# Patient Record
Sex: Female | Born: 1975 | Race: Black or African American | Hispanic: No | Marital: Married | Smoking: Never smoker
Health system: Southern US, Community
[De-identification: ages and names within clinical notes are randomized; demographics above are authoritative.]

## PROBLEM LIST (undated history)

## (undated) DIAGNOSIS — D75839 Thrombocytosis, unspecified: Secondary | ICD-10-CM

## (undated) HISTORY — PX: TUBAL LIGATION: SHX77

---

## 2016-07-03 ENCOUNTER — Emergency Department (HOSPITAL_COMMUNITY): Payer: Managed Care, Other (non HMO)

## 2016-07-03 ENCOUNTER — Encounter (HOSPITAL_COMMUNITY): Payer: Self-pay

## 2016-07-03 ENCOUNTER — Emergency Department (HOSPITAL_COMMUNITY)
Admission: EM | Admit: 2016-07-03 | Discharge: 2016-07-04 | Disposition: A | Discharge status: Home / Self Care | Payer: Managed Care, Other (non HMO) | Attending: Emergency Medicine | Admitting: Emergency Medicine

## 2016-07-03 DIAGNOSIS — R0602 Shortness of breath: Secondary | ICD-10-CM | POA: Insufficient documentation

## 2016-07-03 DIAGNOSIS — Z79899 Other long term (current) drug therapy: Secondary | ICD-10-CM | POA: Insufficient documentation

## 2016-07-03 LAB — BASIC METABOLIC PANEL
Anion gap: 8 (ref 5–15)
BUN: 9 mg/dL (ref 6–20)
CALCIUM: 9.3 mg/dL (ref 8.9–10.3)
CO2: 21 mmol/L — AB (ref 22–32)
CREATININE: 0.82 mg/dL (ref 0.44–1.00)
Chloride: 110 mmol/L (ref 101–111)
GFR calc non Af Amer: >60 mL/min (ref >60)
GLUCOSE: 101 mg/dL — AB (ref 65–99)
Potassium: 3.6 mmol/L (ref 3.5–5.1)
Sodium: 139 mmol/L (ref 135–145)

## 2016-07-03 LAB — CBC
HCT: 30.6 % — ABNORMAL LOW (ref 36.0–46.0)
Hemoglobin: 9.1 g/dL — ABNORMAL LOW (ref 12.0–15.0)
MCH: 22.7 pg — AB (ref 26.0–34.0)
MCHC: 29.7 g/dL — AB (ref 30.0–36.0)
MCV: 76.3 fL — ABNORMAL LOW (ref 78.0–100.0)
PLATELETS: 458 10*3/uL — AB (ref 150–400)
RBC: 4.01 MIL/uL (ref 3.87–5.11)
RDW: 18.1 % — AB (ref 11.5–15.5)
WBC: 4.6 10*3/uL (ref 4.0–10.5)

## 2016-07-03 LAB — I-STAT TROPONIN, ED: TROPONIN I, POC: 0 ng/mL (ref 0.00–0.08)

## 2016-07-03 LAB — I-STAT BETA HCG BLOOD, ED (MC, WL, AP ONLY): I-stat hCG, quantitative: <5 m[IU]/mL (ref <5)

## 2016-07-03 IMAGING — CT CT ANGIO CHEST
2 of 6 series · 19 of 36 positions shown · IV contrast (ISOVUE 370)
Comparison: Chest radiographs earlier this day.

CLINICAL DATA: Left-sided chest pain for 4 days. Shortness of
breath last night. Dyspnea on exertion.

EXAM:
CT ANGIOGRAPHY CHEST WITH CONTRAST
TECHNIQUE: Multidetector CT imaging of the chest was performed using the
standard protocol during bolus administration of intravenous
contrast. Multiplanar CT image reconstructions and MIPs were
obtained to evaluate the vascular anatomy.
CONTRAST:  100 cc Isovue 370 IV

[Series 7: thins for pacs · axial · 0.65mm/px · z∈[+1437,+1655]mm · 18 of 244 slices shown]
[im 13/244  lung]
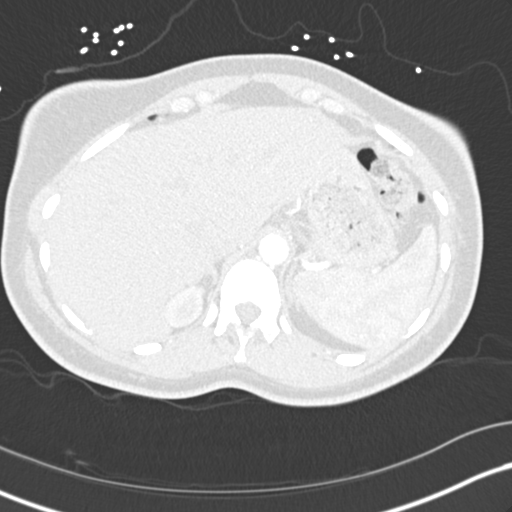
[im 25/244  mediastinal]
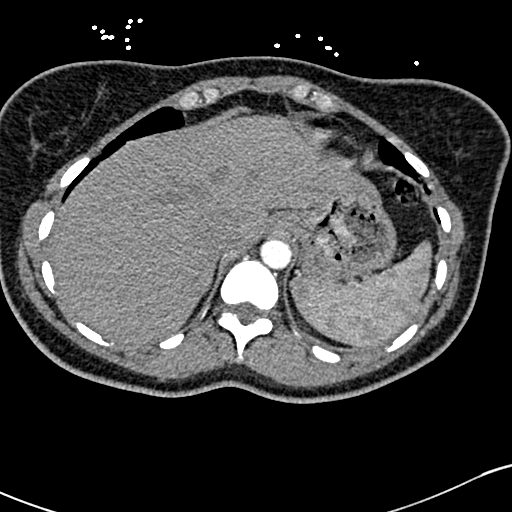
[im 37/244  lung]
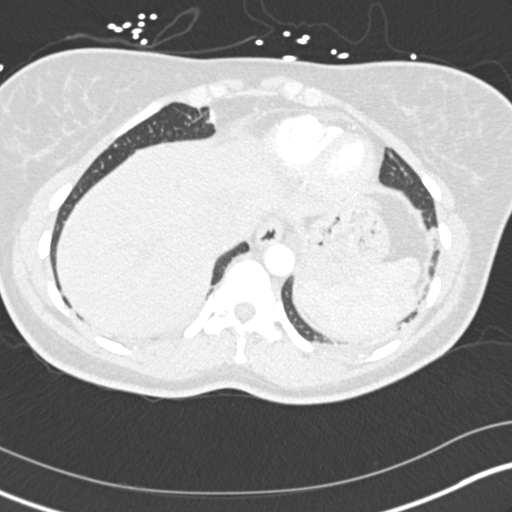
[im 49/244  mediastinal]
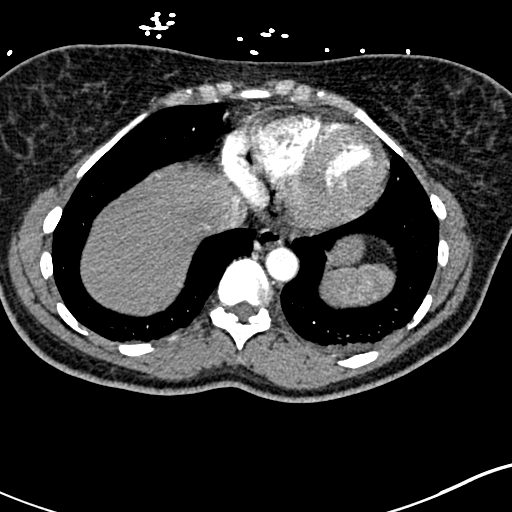
[im 61/244  lung]
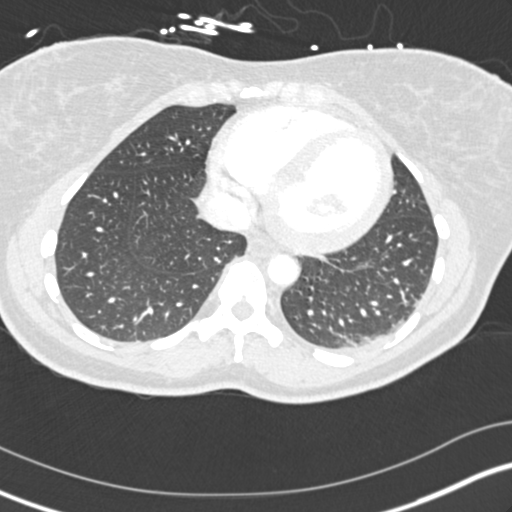
[im 73/244  mediastinal]
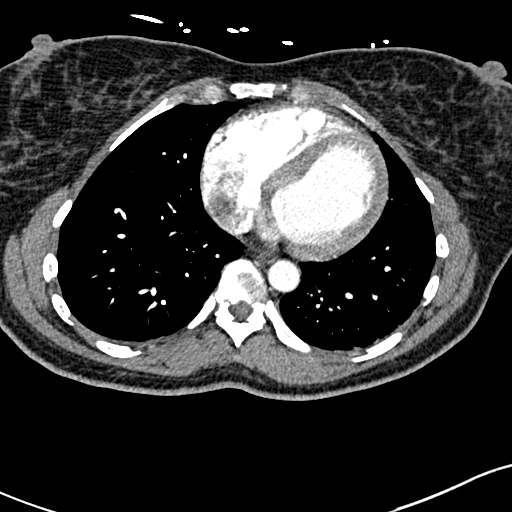
[im 86/244  lung]
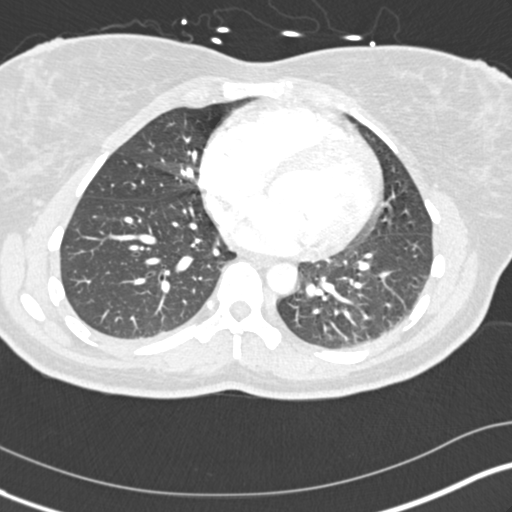
[im 98/244  mediastinal]
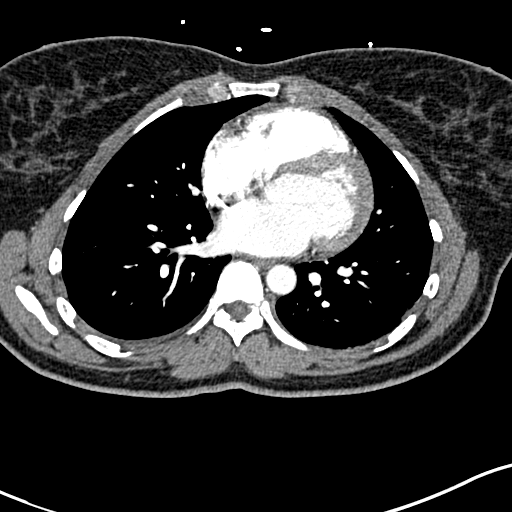
[im 110/244  lung]
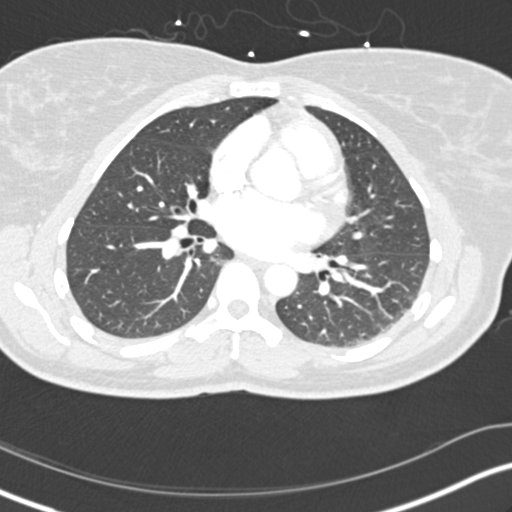
[im 134/244  mediastinal]
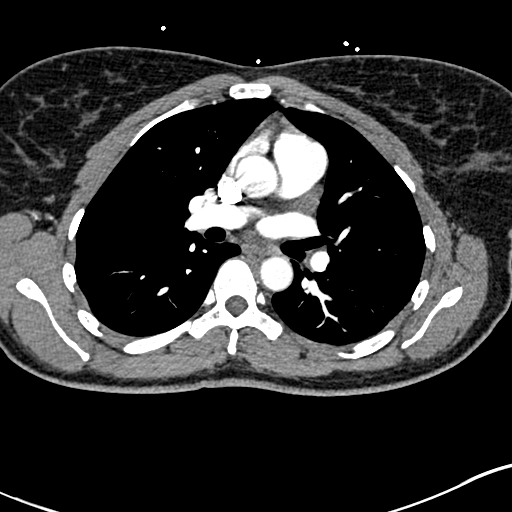
[im 146/244  lung]
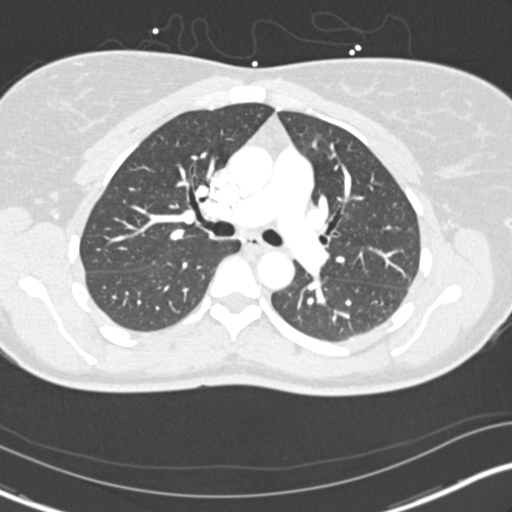
[im 158/244  mediastinal]
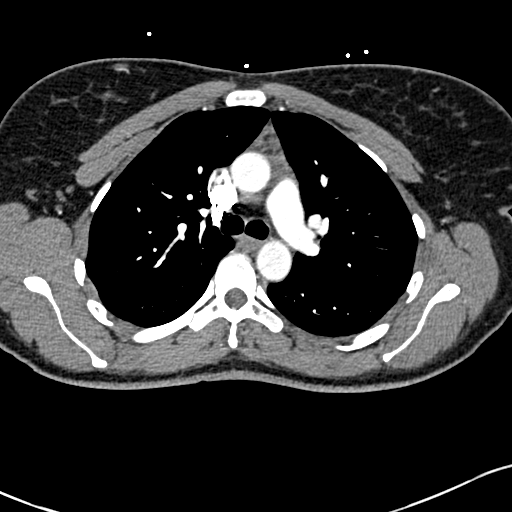
[im 171/244  lung]
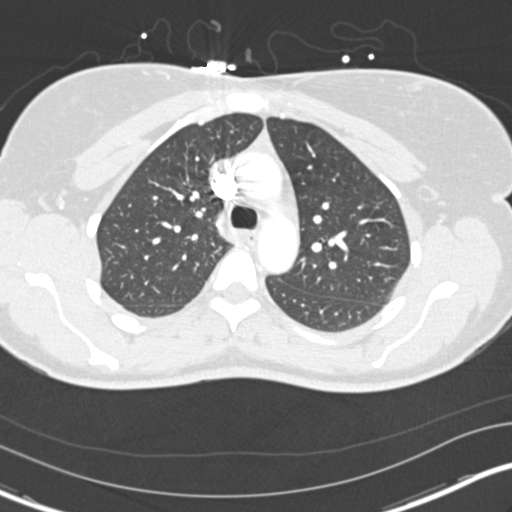
[im 183/244  mediastinal]
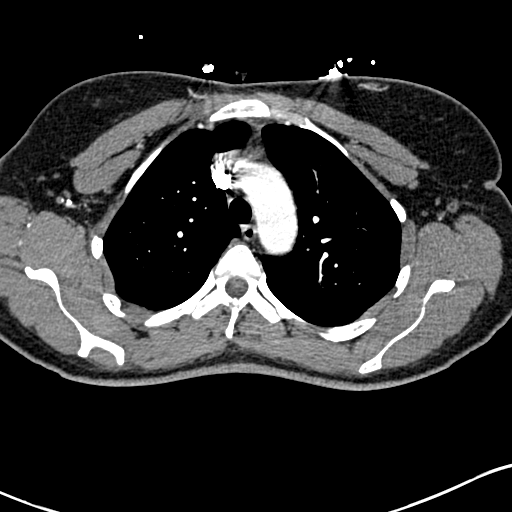
[im 195/244  lung]
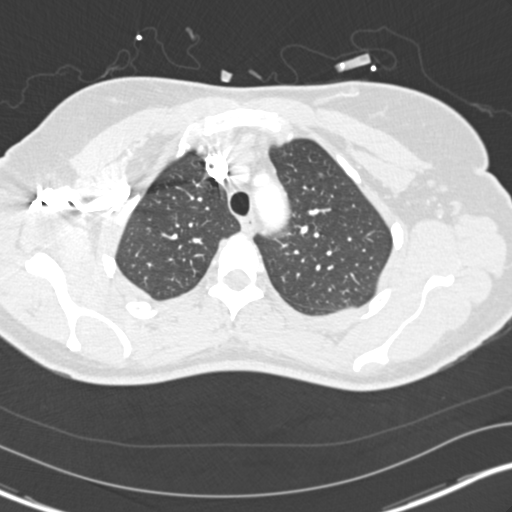
[im 207/244  mediastinal]
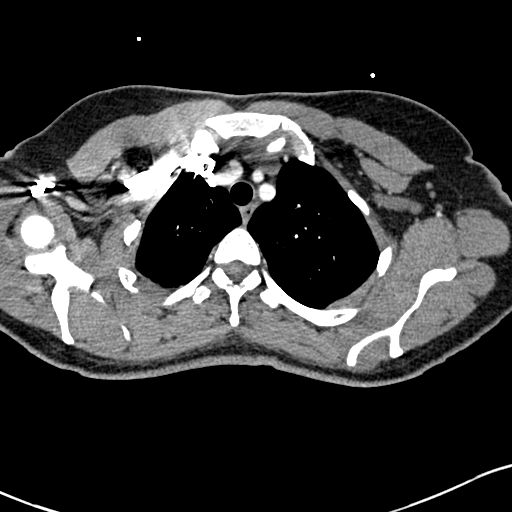
[im 219/244  lung]
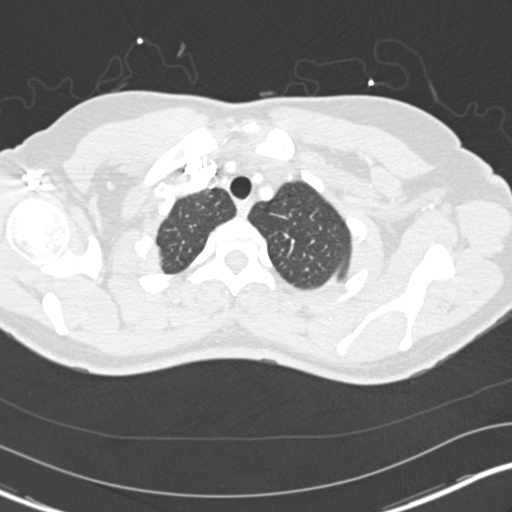
[im 231/244  mediastinal]
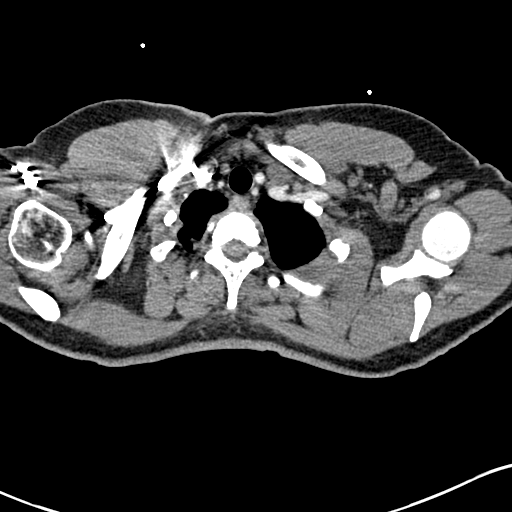

[Series 9: coronal mpr · coronal · 0.49mm/px · 1 of 108 slices shown]
[im 54/108  mediastinal]
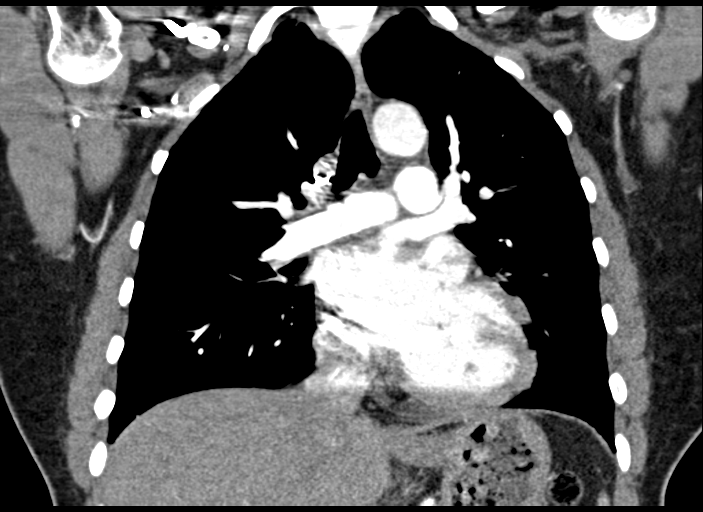

[19 of 36 positions shown; findings below may reference images not displayed]

FINDINGS: Cardiovascular: There are no filling defects within the pulmonary
arteries to suggest pulmonary embolus. Normal caliber thoracic aorta
without dissection. Normal heart size. No pericardial effusion.

Mediastinum/Nodes: No mediastinal, hilar, or axillary lymph nodes.
Minimal fluid in the distal esophagus, esophagus otherwise
decompressed. Minimal soft tissue density anterior mediastinum
likely residual or recurrent thymus. Visualized thyroid gland is
normal.

Lungs/Pleura: Trace dependent left lung atelectasis. No confluent
airspace disease or pulmonary edema. No pleural effusion. No
pulmonary mass or suspicious nodule.

Upper Abdomen: Probable cortical scarring in the upper right kidney.
No acute abnormality.

Musculoskeletal: There are no acute or suspicious osseous
abnormalities.

Review of the MIP images confirms the above findings.
IMPRESSION: 1. No pulmonary embolus.
2. Minimal left lung base atelectasis.  Otherwise clear lungs.

## 2016-07-03 IMAGING — CR DG CHEST 2V
2 series · 2 of 2 positions shown · non-contrast
Comparison: None.

CLINICAL DATA: Chest pain and short of breath

EXAM:
CHEST  2 VIEW

[w chest lat]
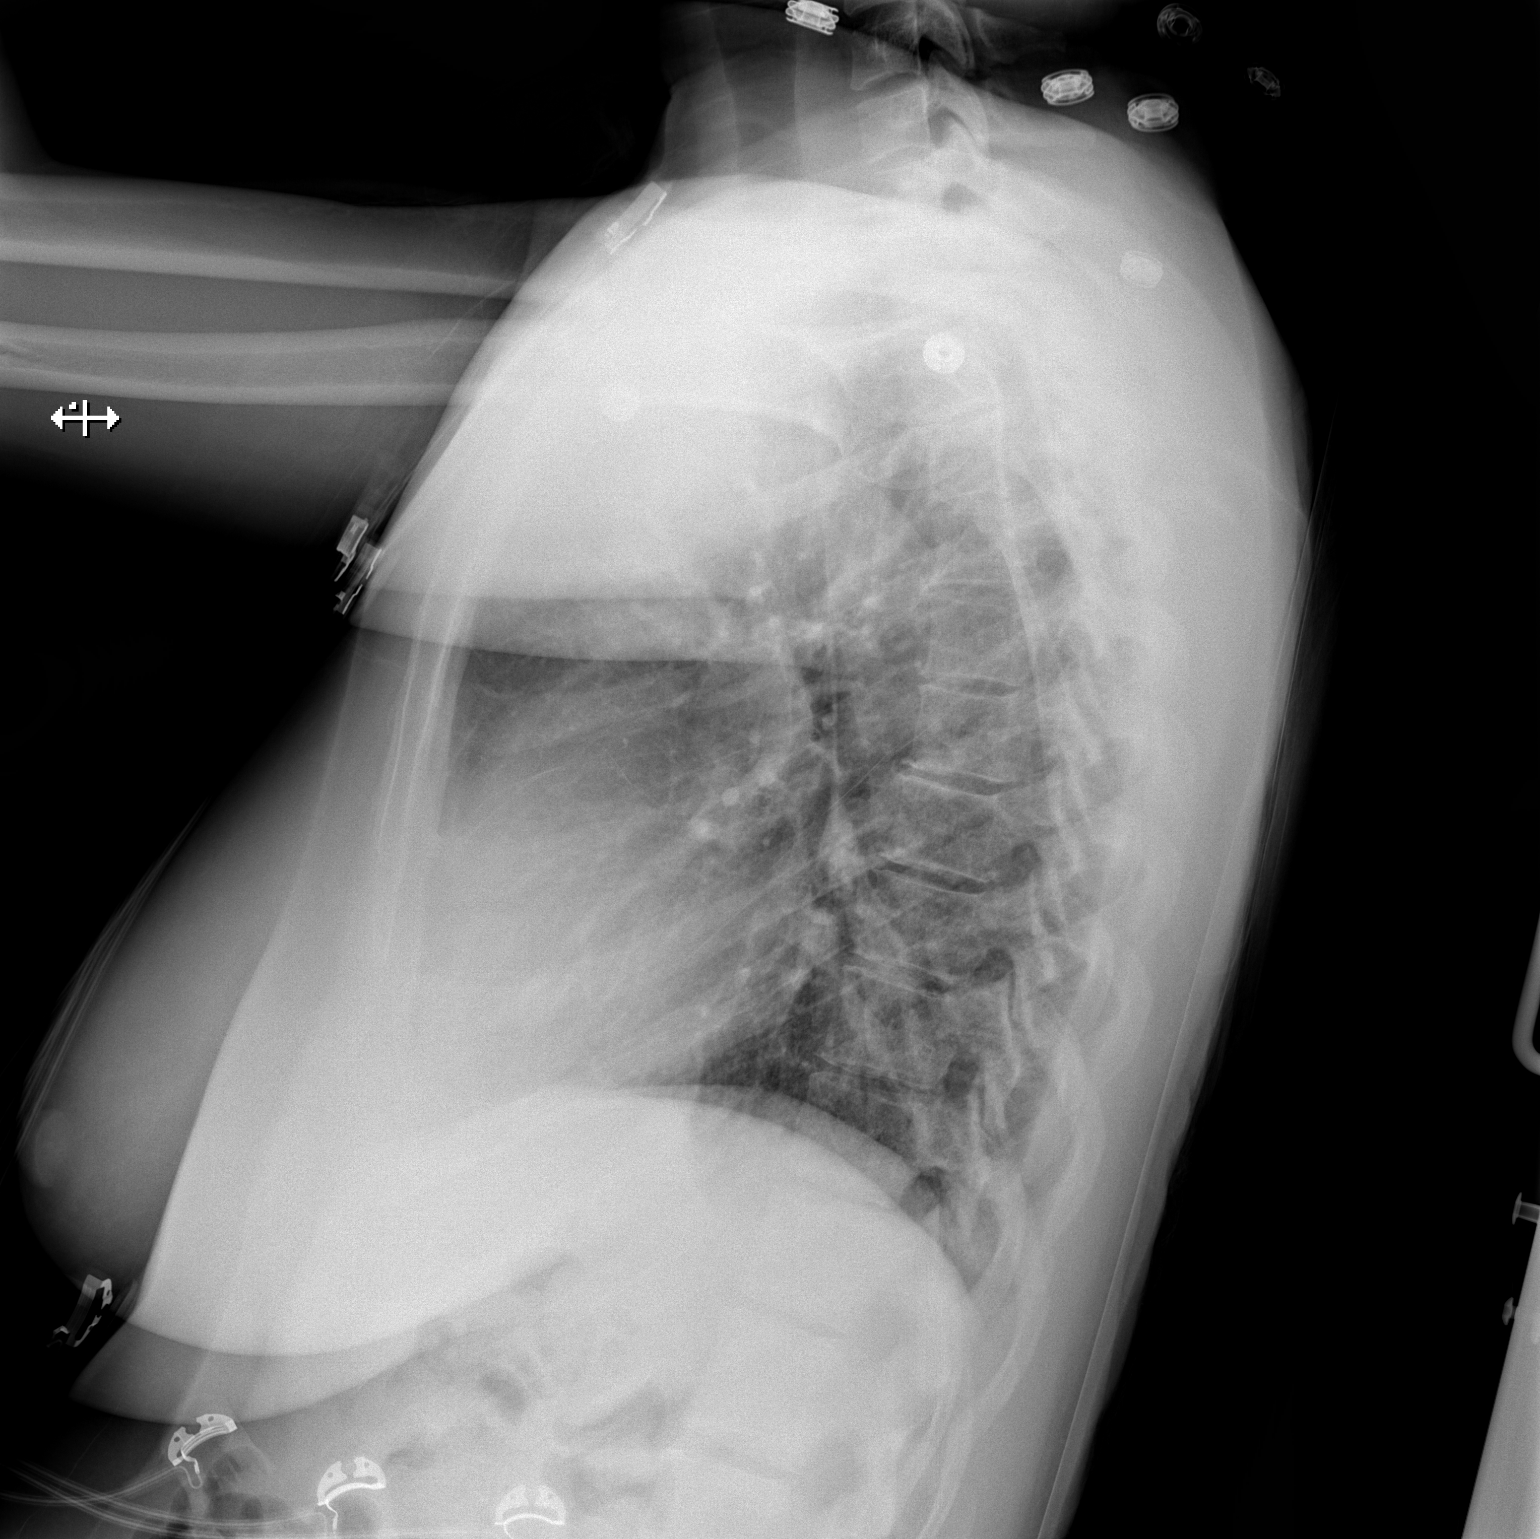

[x chest ap]
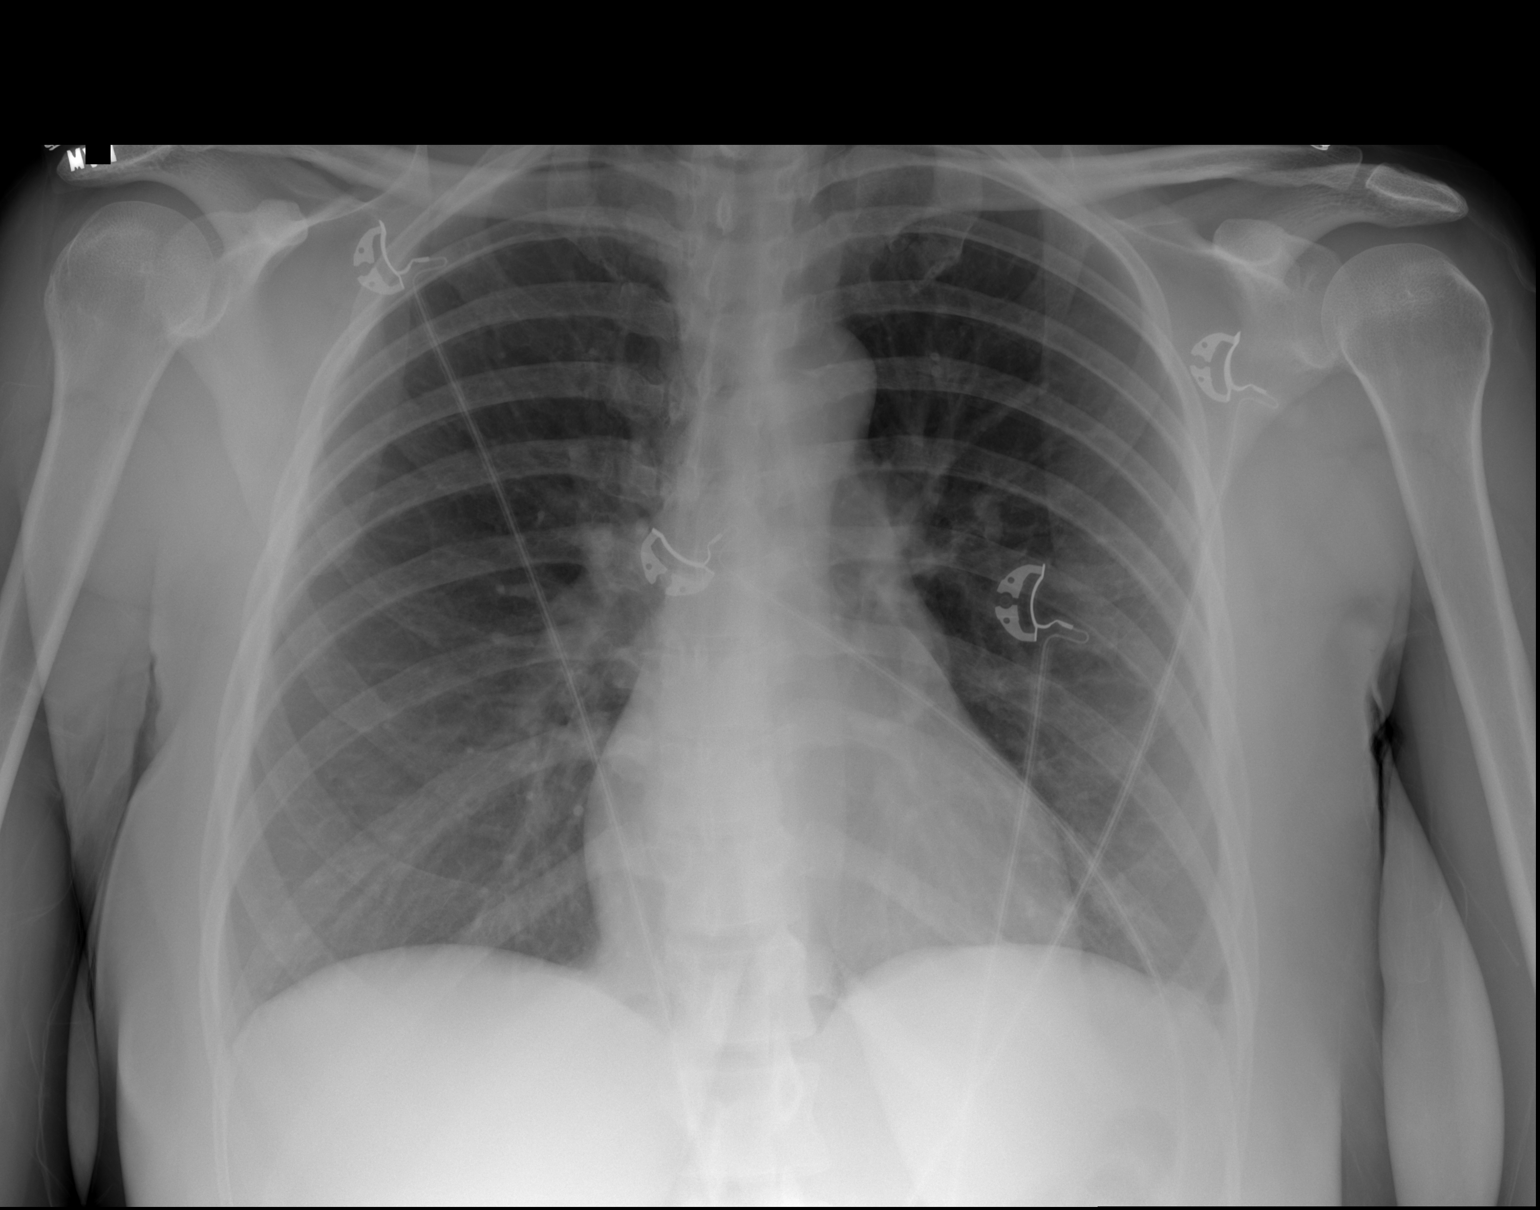

[2 of 2 positions shown; findings below may reference images not displayed]

FINDINGS: The heart size and mediastinal contours are within normal limits.
Both lungs are clear. The visualized skeletal structures are
unremarkable.
IMPRESSION: No active cardiopulmonary disease.

## 2016-07-03 MED ORDER — KETOROLAC TROMETHAMINE 30 MG/ML IJ SOLN
30.0000 mg | Freq: Once | INTRAMUSCULAR | Status: AC
  Administered 2016-07-03: 30 mg via INTRAVENOUS
  Filled 2016-07-03: qty 1

## 2016-07-03 MED ORDER — SODIUM CHLORIDE 0.9 % IJ SOLN
INTRAMUSCULAR | Status: AC
  Filled 2016-07-03: qty 50

## 2016-07-03 MED ORDER — IOPAMIDOL (ISOVUE-370) INJECTION 76%
INTRAVENOUS | Status: AC
  Filled 2016-07-03: qty 100

## 2016-07-03 MED ORDER — LORAZEPAM 2 MG/ML IJ SOLN
0.5000 mg | Freq: Once | INTRAMUSCULAR | Status: AC
  Administered 2016-07-03: 0.5 mg via INTRAVENOUS
  Filled 2016-07-03: qty 1

## 2016-07-03 MED ORDER — IOPAMIDOL (ISOVUE-370) INJECTION 76%
100.0000 mL | Freq: Once | INTRAVENOUS | Status: AC | PRN
  Administered 2016-07-03: 100 mL via INTRAVENOUS

## 2016-07-03 NOTE — ED Triage Notes (Addendum)
PT C/O LEFT-SIDED CHEST PAIN X4 DAYS. PT STS THAT HER SOB STARTED LAST NIGHT, EVEN AT REST. PT STS SHE HAS CIRCULATION PROBLEMS TO THE BILATERAL LEGS OF SWELLING AND PAIN WHEN WALKING., AND WAITING FOR HER APPOINTMENT ON 12/12. PT STS A FAMILY HX OF CIRCULATION PROBLEMS.

## 2016-07-03 NOTE — ED Provider Notes (Signed)
[facility] DEPT Provider Note   CSN: [id number] Arrival date & time: 07/03/16  1845  By signing my name below, I, Julien Nordmann, attest that this documentation has been prepared under the direction and in the presence of [employer], PA-C.  Electronically Signed: Julien Nordmann, ED Scribe. 07/03/16. 8:21 PM.    History   Chief Complaint Chief Complaint  Patient presents with  . Chest Pain  . Shortness of Breath    The history is provided by the patient. No language interpreter was used.   HPI Comments: Paula Jarvis is a 40 y.o. female who presents to the Emergency Department complaining of intermittent, moderate, left sided chest pain x 4 days. Pt reports constant, gradual worsening shortness of breath since 7 pm last night. Pt says that she was washing dishes last night when she began to have sudden shortness of breath. She notes that her chest pain has been intermittent for the past 4 day and she believed that she was catching a cold. She expresses increased pain when swallowing, stating "it feels like I'm swallowing a golf ball". Pt notes that she has no modifying factors for her symptoms. Pt notes that she has "lumps" in her legs and is following up with [facility] because of her "problems with circulation in her legs". She reports calling them earlier today and they advised her to come to the ED. Pt says that she has never had chest pain like this before. She has no maternal hx of DVT in legs or lungs. Pt denies fever, rhinorrhea, cough, hematochezia, dizziness, hx of anxiety. She reports some lightheadedness, but states this is not new for her with SOB onset. Pt is not on any hormonal therapies and has not had any recent surgeries in the past 6 months. She has an appt on 07/09/16.   History reviewed. No pertinent past medical history.  There are no active problems to display for this patient.   Past Surgical History:  Procedure Laterality Date  . TUBAL LIGATION       OB History    No data available       Home Medications    Prior to Admission medications   Medication Sig Start Date End Date Taking? Authorizing Provider  LORazepam (ATIVAN) 1 MG tablet Take 1 tablet (1 mg total) by mouth 3 (three) times daily as needed for anxiety. 07/04/16   Antonietta Breach, PA-C  naproxen (NAPROSYN) 500 MG tablet Take 1 tablet (500 mg total) by mouth 2 (two) times daily. 07/04/16   Antonietta Breach, PA-C    Family History History reviewed. No pertinent family history.  Social History Social History  Substance Use Topics  . Smoking status: Never Smoker  . Smokeless tobacco: Never Used  . Alcohol use No     Allergies   Patient has no known allergies.   Review of Systems Review of Systems  Constitutional: Negative for fever.  HENT: Negative for rhinorrhea.   Respiratory: Positive for shortness of breath. Negative for cough.   Cardiovascular: Positive for chest pain.  Neurological: Positive for light-headedness. Negative for dizziness.  A complete 10 system review of systems was obtained and all systems are negative except as noted in the HPI and PMH.    Physical Exam Updated Vital Signs BP 98/64 (BP Location: Left Arm)   Pulse 64   Temp 97.8 F (36.6 C) (Oral)   Resp 18   LMP 07/01/2016   SpO2 100%   Physical Exam  Constitutional: She is oriented to  person, place, and time. She appears well-developed and well-nourished. No distress.  Patient in no distress. She is nontoxic appearing.  HENT:  Head: Normocephalic and atraumatic.  Eyes: Conjunctivae and EOM are normal. No scleral icterus.  Neck: Normal range of motion.  No JVD  Cardiovascular: Normal rate, regular rhythm and intact distal pulses.   Pulmonary/Chest: Breath sounds normal. No accessory muscle usage. Tachypnea noted. She has no wheezes. She has no rales.  Patient speaking in truncated sentences. There is mild tachypnea as well as dyspnea. Lung sounds grossly clear bilaterally. Chest  expansion symmetric.  Musculoskeletal: Normal range of motion.  No lower extremity pitting edema. Compartments soft.  Neurological: She is alert and oriented to person, place, and time. She exhibits normal muscle tone. Coordination normal.  GCS 15. Patient moving all extremities.  Skin: Skin is warm and dry. No rash noted. She is not diaphoretic. No erythema. No pallor.  Psychiatric: She has a normal mood and affect. Her behavior is normal.  Nursing note and vitals reviewed.    ED Treatments / Results  DIAGNOSTIC STUDIES: Oxygen Saturation is 100% on RA, normal by my interpretation.  COORDINATION OF CARE:  8:19 PM Discussed treatment plan with pt at bedside and pt agreed to plan.  Labs (all labs ordered are listed, but only abnormal results are displayed) Labs Reviewed  BASIC METABOLIC PANEL - Abnormal; Notable for the following:       Result Value   CO2 21 (*)    Glucose, Bld 101 (*)    All other components within normal limits  CBC - Abnormal; Notable for the following:    Hemoglobin 9.1 (*)    HCT 30.6 (*)    MCV 76.3 (*)    MCH 22.7 (*)    MCHC 29.7 (*)    RDW 18.1 (*)    Platelets 458 (*)    All other components within normal limits  I-STAT TROPOININ, ED  I-STAT BETA HCG BLOOD, ED ([facility], [facility], [facility] ONLY)    EKG  EKG Interpretation  Date/Time:  Wednesday July 03 2016 19:15:28 EST Ventricular Rate:  78 PR Interval:    QRS Duration: 89 QT Interval:  385 QTC Calculation: 439 R Axis:   80 Text Interpretation:  Sinus rhythm No old tracing to compare EKG WITHIN NORMAL LIMITS Confirmed by Ellender Hose MD, [place] [phone]) on 07/03/2016 11:53:37 PM       Radiology Dg Chest 2 View  Result Date: 07/03/2016 CLINICAL DATA:  Chest pain and short of breath EXAM: CHEST  2 VIEW COMPARISON:  None. FINDINGS: The heart size and mediastinal contours are within normal limits. Both lungs are clear. The visualized skeletal structures are unremarkable. IMPRESSION: No active cardiopulmonary  disease. Electronically Signed   By: Franchot Gallo M.D.   On: 07/03/2016 19:52   Ct Angio Chest Pe W And/or Wo Contrast  Result Date: 07/03/2016 CLINICAL DATA:  Left-sided chest pain for 4 days. Shortness of breath last night. Dyspnea on exertion. EXAM: CT ANGIOGRAPHY CHEST WITH CONTRAST TECHNIQUE: Multidetector CT imaging of the chest was performed using the standard protocol during bolus administration of intravenous contrast. Multiplanar CT image reconstructions and MIPs were obtained to evaluate the vascular anatomy. CONTRAST:  100 cc Isovue 370 IV COMPARISON:  Chest radiographs earlier this day. FINDINGS: Cardiovascular: There are no filling defects within the pulmonary arteries to suggest pulmonary embolus. Normal caliber thoracic aorta without dissection. Normal heart size. No pericardial effusion. Mediastinum/Nodes: No mediastinal, hilar, or axillary lymph nodes. Minimal fluid in  the distal esophagus, esophagus otherwise decompressed. Minimal soft tissue density anterior mediastinum likely residual or recurrent thymus. Visualized thyroid gland is normal. Lungs/Pleura: Trace dependent left lung atelectasis. No confluent airspace disease or pulmonary edema. No pleural effusion. No pulmonary mass or suspicious nodule. Upper Abdomen: Probable cortical scarring in the upper right kidney. No acute abnormality. Musculoskeletal: There are no acute or suspicious osseous abnormalities. Review of the MIP images confirms the above findings. IMPRESSION: 1. No pulmonary embolus. 2. Minimal left lung base atelectasis.  Otherwise clear lungs. Electronically Signed   By: Jeb Levering M.D.   On: 07/03/2016 22:22    Procedures Procedures (including critical care time)  Medications Ordered in ED Medications  sodium chloride 0.9 % injection (not administered)  sodium chloride 0.9 % injection (not administered)  iopamidol (ISOVUE-370) 76 % injection (not administered)  iopamidol (ISOVUE-370) 76 % injection 100  mL (100 mLs Intravenous Contrast Given 07/03/16 2158)  LORazepam (ATIVAN) injection 0.5 mg (0.5 mg Intravenous Given 07/03/16 2248)  ketorolac (TORADOL) 30 MG/ML injection 30 mg (30 mg Intravenous Given 07/03/16 2345)     Initial Impression / Assessment and Plan / ED Course  I have reviewed the triage vital signs and the nursing notes.  Pertinent labs & imaging results that were available during my care of the patient were reviewed by me and considered in my medical decision making (see chart for details).  Clinical Course     40 year old nontoxic appearing and pleasant female presents to the emergency department for left sided pleuritic chest pain and dyspnea. Symptoms began 4 days ago. Patient hemodynamically stable since arrival in the emergency department. She is afebrile. Patient visibly appeared dyspneic; however, she never lost oxygen saturations of 100%. No fever to suggest infectious etiology and chest x-ray clear. CT angiogram obtained given increased concern for pulmonary embolus.  CTA reviewed which shows no evidence of PE. Laboratory workup is reassuring. Patient is anemic at 9.1, though it is unlikely for this to be the cause of patient's dyspnea. MCV is low suggesting that anemia is chronic. Patient also has no signs of acute blood loss; no tachycardia or hypotension. It is atypical for dyspnea secondary to anemia to result in pleuritic chest pain. EKG shows no ischemic changes. Troponin is negative. Doubt cardiac etiology given symptom chronicity.  I believe the patient is stable for outpatient management by her primary care doctor. Patient does report having a stressful job. Unable to rule out anxiety. Will start trial of Ativan for this. Patient also prescribe naproxen for pain. Return precautions discussed and provided. Patient discharged in stable condition with no unaddressed concerns.   Final Clinical Impressions(s) / ED Diagnoses   Final diagnoses:  Shortness of breath     New Prescriptions New Prescriptions   LORAZEPAM (ATIVAN) 1 MG TABLET    Take 1 tablet (1 mg total) by mouth 3 (three) times daily as needed for anxiety.   NAPROXEN (NAPROSYN) 500 MG TABLET    Take 1 tablet (500 mg total) by mouth 2 (two) times daily.    I personally performed the services described in this documentation, which was scribed in my presence. The recorded information has been reviewed and is accurate.      Antonietta Breach, PA-C 07/04/16 0500    Duffy Bruce, MD 07/05/16 [phone]

## 2016-07-04 MED ORDER — NAPROXEN 500 MG PO TABS: 500.0000 mg | ORAL_TABLET | Freq: Two times a day (BID) | ORAL | 0 refills | Status: DC

## 2016-07-04 MED ORDER — LORAZEPAM 1 MG PO TABS: 1.0000 mg | ORAL_TABLET | Freq: Three times a day (TID) | ORAL | 0 refills | Status: DC | PRN

## 2016-07-04 NOTE — ED Notes (Signed)
Patient denies pain and is resting comfortably.  

## 2016-07-04 NOTE — Discharge Instructions (Signed)
Your workup today was reassuring. We advise that you use an incentive spirometer once per hour while awake. Take naproxen as prescribed for pain. You may try Ativan as your symptoms may be due to underlying anxiety and stress. Follow-up with your primary care doctor regarding your visit.

## 2018-09-22 ENCOUNTER — Encounter (HOSPITAL_COMMUNITY): Payer: Self-pay | Admitting: Emergency Medicine

## 2018-09-22 ENCOUNTER — Emergency Department (HOSPITAL_COMMUNITY)
Admission: EM | Admit: 2018-09-22 | Discharge: 2018-09-22 | Disposition: A | Discharge status: Home / Self Care | Payer: Self-pay | Attending: Emergency Medicine | Admitting: Emergency Medicine

## 2018-09-22 ENCOUNTER — Other Ambulatory Visit: Payer: Self-pay

## 2018-09-22 ENCOUNTER — Emergency Department (HOSPITAL_COMMUNITY): Payer: Self-pay

## 2018-09-22 DIAGNOSIS — M62838 Other muscle spasm: Secondary | ICD-10-CM | POA: Insufficient documentation

## 2018-09-22 IMAGING — DX DG SHOULDER 2+V*L*
3 series · 3 of 3 positions shown · non-contrast
Comparison: None.

CLINICAL DATA: Left shoulder pain, swelling

EXAM:
LEFT SHOULDER - 2+ VIEW

[shoulder grashey]
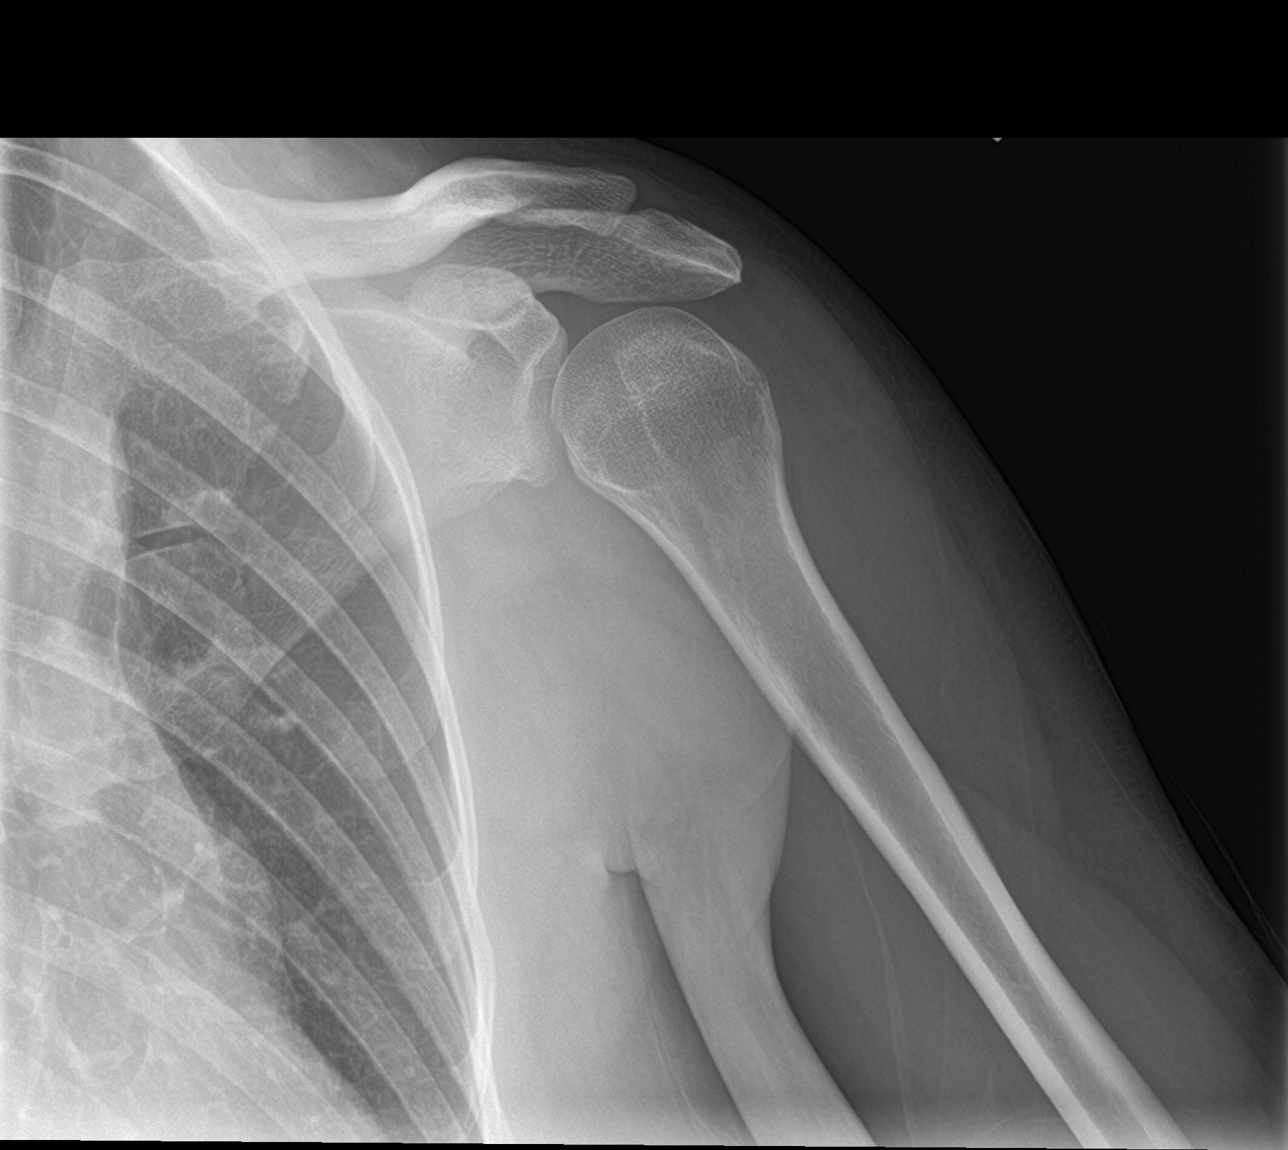

[shoulder y view]
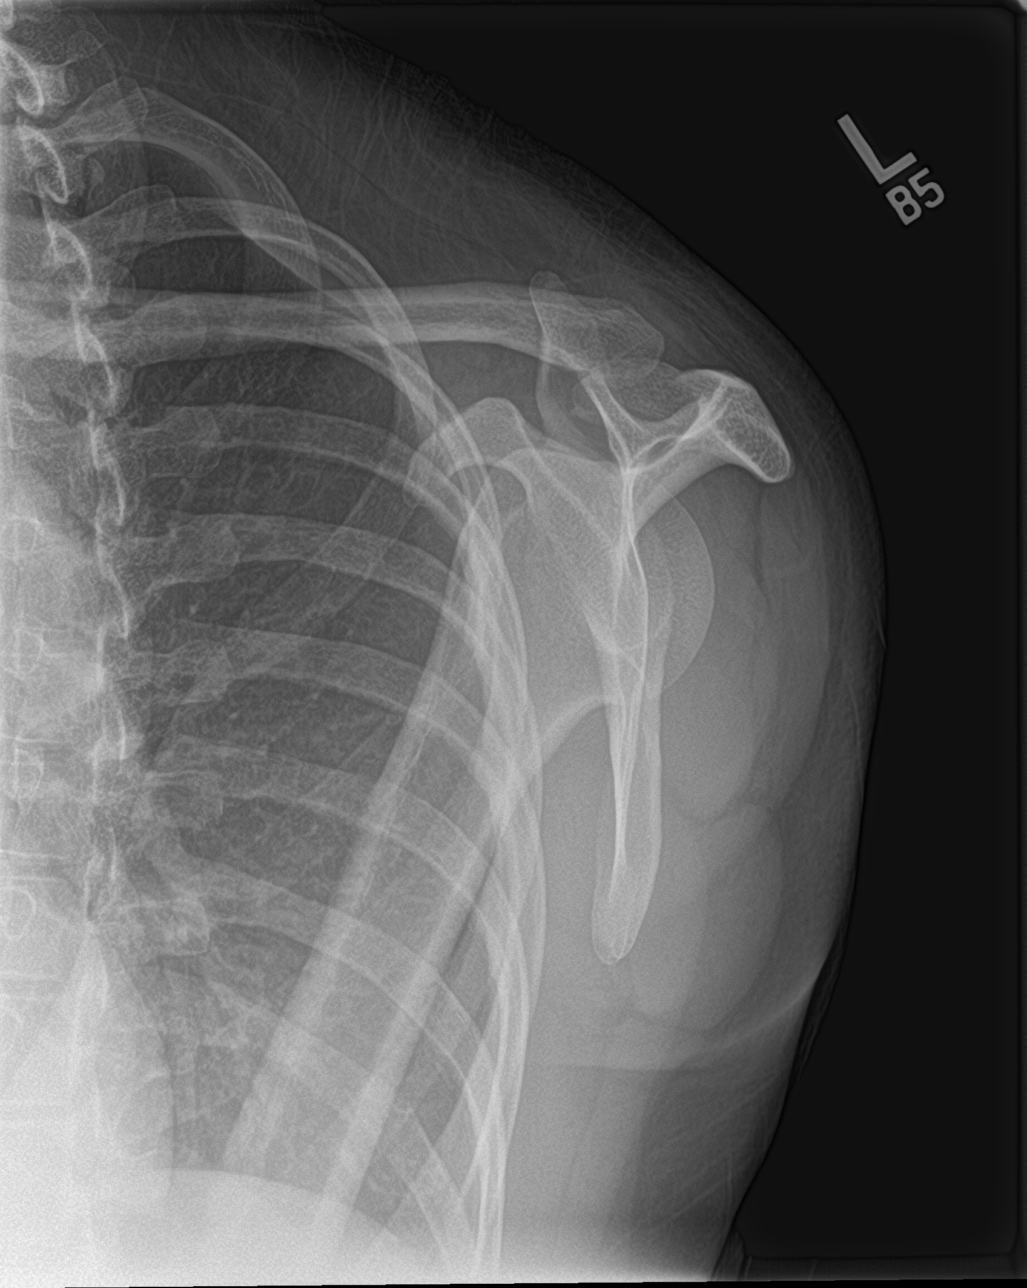

[shoulder axillary]
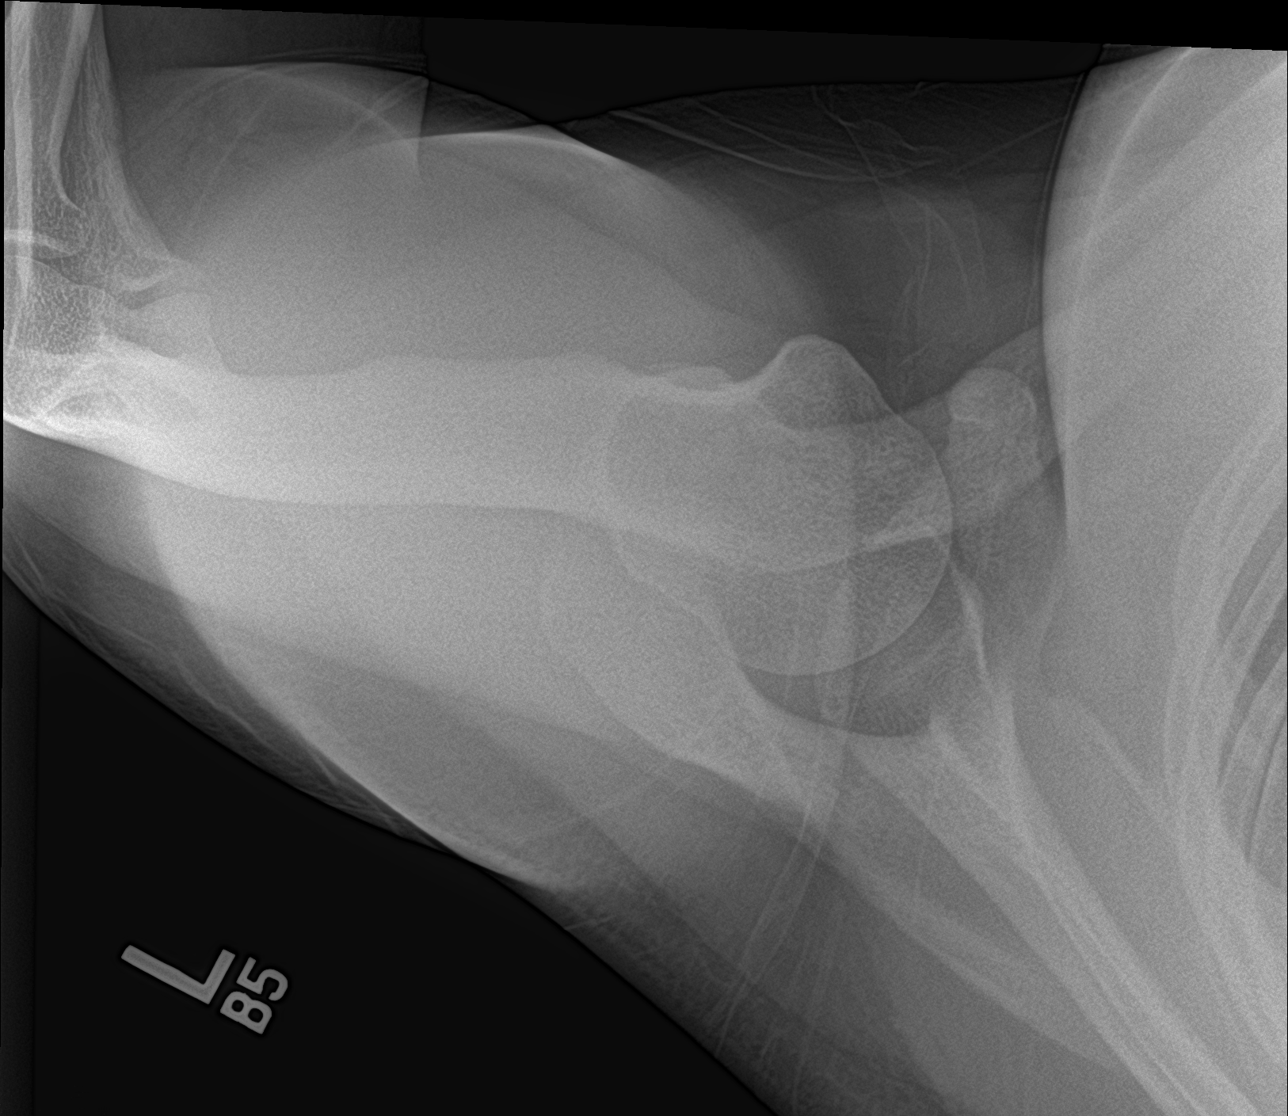

[3 of 3 positions shown; findings below may reference images not displayed]

FINDINGS: There is no evidence of fracture or dislocation. There is no
evidence of arthropathy or other focal bone abnormality. Soft
tissues are unremarkable.
IMPRESSION: Negative.

## 2018-09-22 MED ORDER — CYCLOBENZAPRINE HCL 10 MG PO TABS: 5.0000 mg | ORAL_TABLET | Freq: Two times a day (BID) | ORAL | 0 refills | Status: DC | PRN

## 2018-09-22 MED ORDER — MELOXICAM 15 MG PO TABS: 15.0000 mg | ORAL_TABLET | Freq: Every day | ORAL | 0 refills | Status: DC

## 2018-09-22 NOTE — ED Triage Notes (Signed)
Pt presents with left shoulder pain and swelling with tingling sensation x4 days. Denies any new injury.

## 2018-09-22 NOTE — ED Notes (Signed)
Discharge instructions and prescriptions discussed with Pt. Pt verbalized understanding. Pt stable and ambulatory.   

## 2018-09-22 NOTE — Discharge Instructions (Addendum)
Get help right away if: Your arm, hand, or fingers: Tingle. Become numb. Become swollen. Become painful. Turn white or blue. 

## 2018-09-22 NOTE — ED Provider Notes (Signed)
[facility] EMERGENCY DEPARTMENT Provider Note   CSN: [id number] Arrival date & time: 09/22/18  1946    History   Chief Complaint Chief Complaint  Patient presents with  . Shoulder Pain    HPI Paula Jarvis is a 43 y.o. female who presents the emergency department chief complaint of left shoulder pain.  She is a past medical history of previous shoulder injury.  She had a soft tissue injury as diagnosed by an outside urgent care physician.  Patient states that since that time she has had some pain with movement of her shoulder.  She has a child with spina bifida and was leaning down to reach for something when she felt spasm occur in her left shoulder blade region.  Since that time she has had severe pain in her pectoralis, trapezius and right shoulder.  She is afraid to move the shoulder.  She denies pain with joint movement, numbness, tingling, stiffness, neck pain.     HPI  History reviewed. No pertinent past medical history.  There are no active problems to display for this patient.   Past Surgical History:  Procedure Laterality Date  . TUBAL LIGATION       OB History   No obstetric history on file.      Home Medications    Prior to Admission medications   Medication Sig Start Date End Date Taking? Authorizing Provider  LORazepam (ATIVAN) 1 MG tablet Take 1 tablet (1 mg total) by mouth 3 (three) times daily as needed for anxiety. 07/04/16   Antonietta Breach, PA-C  naproxen (NAPROSYN) 500 MG tablet Take 1 tablet (500 mg total) by mouth 2 (two) times daily. 07/04/16   Antonietta Breach, PA-C    Family History No family history on file.  Social History Social History   Tobacco Use  . Smoking status: Never Smoker  . Smokeless tobacco: Never Used  Substance Use Topics  . Alcohol use: No  . Drug use: No     Allergies   Patient has no known allergies.   Review of Systems Review of Systems Ten systems reviewed and are negative for acute change,  except as noted in the HPI.   Physical Exam Updated Vital Signs BP (!) 126/52 (BP Location: Right Arm)   Pulse 87   Temp 97.8 F (36.6 C) (Oral)   Resp 17   SpO2 100%   Physical Exam Vitals signs and nursing note reviewed.  Constitutional:      General: She is not in acute distress.    Appearance: She is well-developed. She is not diaphoretic.  HENT:     Head: Normocephalic and atraumatic.  Eyes:     General: No scleral icterus.    Conjunctiva/sclera: Conjunctivae normal.  Neck:     Musculoskeletal: Normal range of motion.  Cardiovascular:     Rate and Rhythm: Normal rate and regular rhythm.     Heart sounds: Normal heart sounds. No murmur. No friction rub. No gallop.   Pulmonary:     Effort: Pulmonary effort is normal. No respiratory distress.     Breath sounds: Normal breath sounds.  Abdominal:     General: Bowel sounds are normal. There is no distension.     Palpations: Abdomen is soft. There is no mass.     Tenderness: There is no abdominal tenderness. There is no guarding.  Musculoskeletal:     Comments: Is a spasm and guarding in the left trapezius and pectoralis muscle, active trigger points present.  Patient resisting passive and active range of motion in the shoulder joint secondary to muscle spasm in the shoulder girdle region.  She has normal grip strength and sensation, normal radial pulse.  Skin:    General: Skin is warm and dry.  Neurological:     Mental Status: She is alert and oriented to person, place, and time.  Psychiatric:        Behavior: Behavior normal.      ED Treatments / Results  Labs (all labs ordered are listed, but only abnormal results are displayed) Labs Reviewed - No data to display  EKG None  Radiology Dg Shoulder Left  Result Date: 09/22/2018 CLINICAL DATA:  Left shoulder pain, swelling EXAM: LEFT SHOULDER - 2+ VIEW COMPARISON:  None. FINDINGS: There is no evidence of fracture or dislocation. There is no evidence of arthropathy  or other focal bone abnormality. Soft tissues are unremarkable. IMPRESSION: Negative. Electronically Signed   By: Rolm Baptise M.D.   On: 09/22/2018 21:00    Procedures Procedures (including critical care time)  Medications Ordered in ED Medications - No data to display   Initial Impression / Assessment and Plan / ED Course  I have reviewed the triage vital signs and the nursing notes.  Pertinent labs & imaging results that were available during my care of the patient were reviewed by me and considered in my medical decision making (see chart for details).        Patient with acute muscle spasm.  Negative left shoulder x-ray.  I personally reviewed the left shoulder plain film and agree with the radiologic interpretation.  Suspect this is all related to muscle spasm.  Patient is advised to follow-up with her primary care physician and I feel that she would likely benefit from some outpatient physical therapy.  Encouraged moist heat at home and discussed return precautions with the patient.  Final Clinical Impressions(s) / ED Diagnoses   Final diagnoses:  None    ED Discharge Orders    None       Margarita Mail, PA-C 09/22/18 2226    Malvin Johns, MD 09/23/18 1226

## 2019-01-14 ENCOUNTER — Emergency Department (HOSPITAL_COMMUNITY): Payer: Commercial Managed Care - PPO

## 2019-01-14 ENCOUNTER — Emergency Department (HOSPITAL_COMMUNITY)
Admission: EM | Admit: 2019-01-14 | Discharge: 2019-01-14 | Disposition: A | Discharge status: Home / Self Care | Payer: Commercial Managed Care - PPO | Attending: Emergency Medicine | Admitting: Emergency Medicine

## 2019-01-14 ENCOUNTER — Other Ambulatory Visit: Payer: Self-pay

## 2019-01-14 ENCOUNTER — Encounter (HOSPITAL_COMMUNITY): Payer: Self-pay | Admitting: Emergency Medicine

## 2019-01-14 DIAGNOSIS — H6691 Otitis media, unspecified, right ear: Secondary | ICD-10-CM | POA: Diagnosis not present

## 2019-01-14 DIAGNOSIS — R51 Headache: Secondary | ICD-10-CM | POA: Diagnosis not present

## 2019-01-14 DIAGNOSIS — R6883 Chills (without fever): Secondary | ICD-10-CM | POA: Diagnosis not present

## 2019-01-14 DIAGNOSIS — Z20828 Contact with and (suspected) exposure to other viral communicable diseases: Secondary | ICD-10-CM | POA: Diagnosis not present

## 2019-01-14 DIAGNOSIS — M7918 Myalgia, other site: Secondary | ICD-10-CM | POA: Diagnosis present

## 2019-01-14 DIAGNOSIS — J111 Influenza due to unidentified influenza virus with other respiratory manifestations: Secondary | ICD-10-CM | POA: Diagnosis not present

## 2019-01-14 DIAGNOSIS — H6690 Otitis media, unspecified, unspecified ear: Secondary | ICD-10-CM

## 2019-01-14 LAB — COMPREHENSIVE METABOLIC PANEL
ALT: 15 U/L (ref 0–44)
AST: 17 U/L (ref 15–41)
Albumin: 3.5 g/dL (ref 3.5–5.0)
Alkaline Phosphatase: 37 U/L — ABNORMAL LOW (ref 38–126)
Anion gap: 12 (ref 5–15)
BUN: 9 mg/dL (ref 6–20)
CO2: 20 mmol/L — ABNORMAL LOW (ref 22–32)
Calcium: 8.8 mg/dL — ABNORMAL LOW (ref 8.9–10.3)
Chloride: 107 mmol/L (ref 98–111)
Creatinine, Ser: 0.92 mg/dL (ref 0.44–1.00)
GFR calc Af Amer: >60 mL/min (ref >60)
GFR calc non Af Amer: >60 mL/min (ref >60)
Glucose, Bld: 103 mg/dL — ABNORMAL HIGH (ref 70–99)
Potassium: 3.4 mmol/L — ABNORMAL LOW (ref 3.5–5.1)
Sodium: 139 mmol/L (ref 135–145)
Total Bilirubin: 0.5 mg/dL (ref 0.3–1.2)
Total Protein: 7.4 g/dL (ref 6.5–8.1)

## 2019-01-14 LAB — URINALYSIS, ROUTINE W REFLEX MICROSCOPIC
Bacteria, UA: NONE SEEN
Bilirubin Urine: NEGATIVE
Glucose, UA: NEGATIVE mg/dL
Ketones, ur: 20 mg/dL — AB
Leukocytes,Ua: NEGATIVE
Nitrite: NEGATIVE
Protein, ur: 30 mg/dL — AB
Specific Gravity, Urine: 1.028 (ref 1.005–1.030)
pH: 5 (ref 5.0–8.0)

## 2019-01-14 LAB — CBC WITH DIFFERENTIAL/PLATELET
Abs Immature Granulocytes: 0.02 10*3/uL (ref 0.00–0.07)
Basophils Absolute: 0 10*3/uL (ref 0.0–0.1)
Basophils Relative: 0 %
Eosinophils Absolute: 0 10*3/uL (ref 0.0–0.5)
Eosinophils Relative: 0 %
HCT: 33 % — ABNORMAL LOW (ref 36.0–46.0)
Hemoglobin: 9.8 g/dL — ABNORMAL LOW (ref 12.0–15.0)
Immature Granulocytes: 0 %
Lymphocytes Relative: 26 %
Lymphs Abs: 1.6 10*3/uL (ref 0.7–4.0)
MCH: 24.1 pg — ABNORMAL LOW (ref 26.0–34.0)
MCHC: 29.7 g/dL — ABNORMAL LOW (ref 30.0–36.0)
MCV: 81.1 fL (ref 80.0–100.0)
Monocytes Absolute: 0.7 10*3/uL (ref 0.1–1.0)
Monocytes Relative: 12 %
Neutro Abs: 3.9 10*3/uL (ref 1.7–7.7)
Neutrophils Relative %: 62 %
Platelets: 287 10*3/uL (ref 150–400)
RBC: 4.07 MIL/uL (ref 3.87–5.11)
RDW: 17.9 % — ABNORMAL HIGH (ref 11.5–15.5)
WBC: 6.2 10*3/uL (ref 4.0–10.5)
nRBC: 0 % (ref 0.0–0.2)

## 2019-01-14 LAB — LACTIC ACID, PLASMA: Lactic Acid, Venous: 1 mmol/L (ref 0.5–1.9)

## 2019-01-14 LAB — SARS CORONAVIRUS 2: SARS Coronavirus 2: NOT DETECTED

## 2019-01-14 LAB — I-STAT BETA HCG BLOOD, ED (MC, WL, AP ONLY): I-stat hCG, quantitative: <5 m[IU]/mL (ref <5)

## 2019-01-14 IMAGING — DX PORTABLE CHEST - 1 VIEW
1 series · 1 of 1 positions shown · non-contrast
Comparison: Two-view chest x-ray [DATE]

CLINICAL DATA: Centralized body aches and chills for 3 days. Fever.

EXAM:
PORTABLE CHEST 1 VIEW

[chest ap]
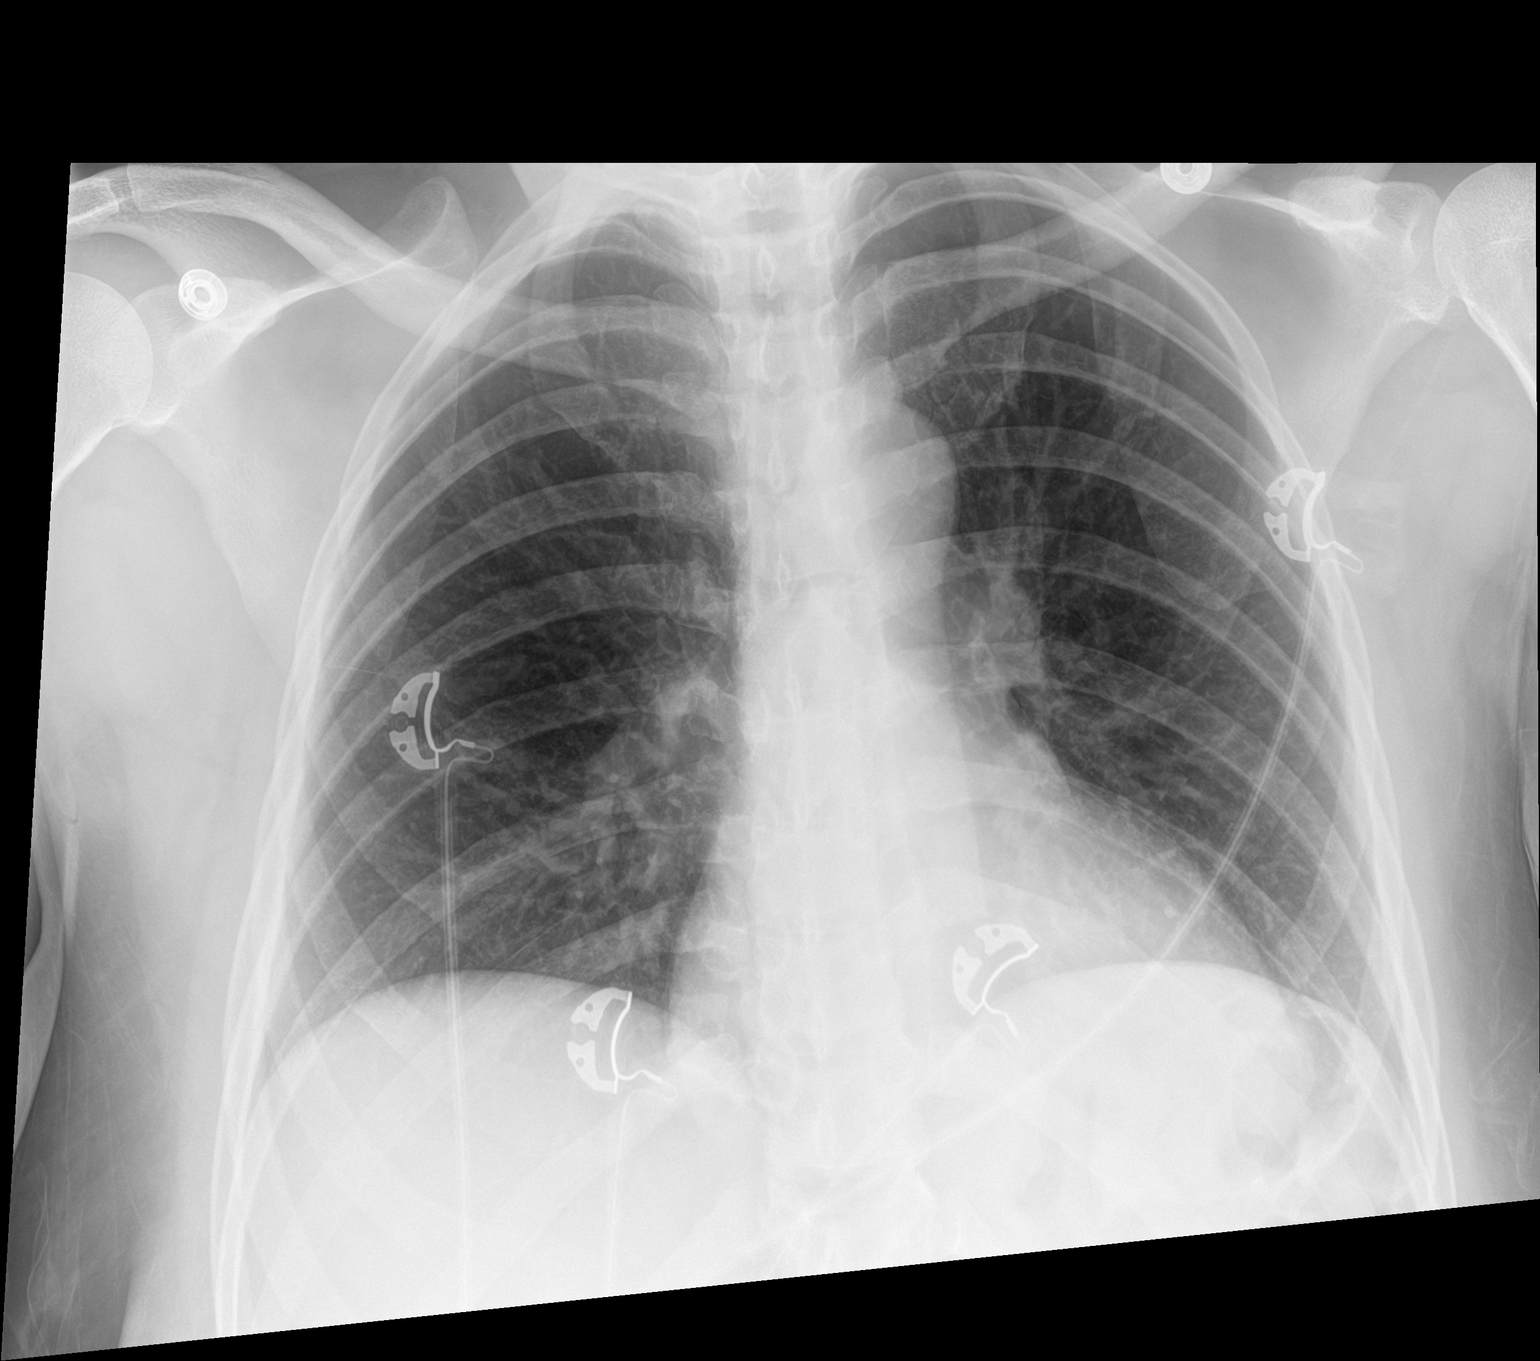

[1 of 1 positions shown; findings below may reference images not displayed]

FINDINGS: The heart size is normal. There is no edema or effusion. Lung
volumes are low. The visualized soft tissues and bony thorax are
unremarkable.
IMPRESSION: 1. Low lung volumes.
2. No acute cardiopulmonary disease.

## 2019-01-14 MED ORDER — AMOXICILLIN 500 MG PO CAPS: 500.0000 mg | ORAL_CAPSULE | Freq: Three times a day (TID) | ORAL | 0 refills | Status: DC

## 2019-01-14 MED ORDER — SODIUM CHLORIDE 0.9 % IV BOLUS (SEPSIS)
1000.0000 mL | Freq: Once | INTRAVENOUS | Status: AC
  Administered 2019-01-14: 1000 mL via INTRAVENOUS

## 2019-01-14 MED ORDER — ACETAMINOPHEN 325 MG PO TABS
650.0000 mg | ORAL_TABLET | Freq: Once | ORAL | Status: AC | PRN
  Administered 2019-01-14: 650 mg via ORAL
  Filled 2019-01-14: qty 2

## 2019-01-14 MED ORDER — IBUPROFEN 400 MG PO TABS
400.0000 mg | ORAL_TABLET | Freq: Once | ORAL | Status: AC
  Administered 2019-01-14: 400 mg via ORAL
  Filled 2019-01-14: qty 1

## 2019-01-14 NOTE — ED Triage Notes (Signed)
Co generalized body aches, headache, chills, and bumps to R axilla since Monday.  Reports vomited on Sunday and Tuesday.

## 2019-01-14 NOTE — ED Notes (Signed)
Attempted to call pt husband multiple times, he doesn't have voicemail set up. Pt is aware Dr Christy Gentles and I have attempted to call but no answer.

## 2019-01-14 NOTE — ED Provider Notes (Signed)
[facility] EMERGENCY DEPARTMENT Provider Note   CSN: [id number] Arrival date & time: 01/14/19  0021     History   Chief Complaint Chief Complaint  Patient presents with  . Generalized Body Aches  . Headache  . Abscess  . Chills    HPI Paula Jarvis is a 43 y.o. female.     The history is provided by the patient.  Fever Severity:  Moderate Onset quality:  Gradual Duration:  2 days Timing:  Constant Progression:  Worsening Chronicity:  New Relieved by:  Nothing Worsened by:  Nothing Associated symptoms: chest pain, chills, ear pain, headaches, myalgias, nausea, sore throat and vomiting   Associated symptoms: no cough   Patient presents for multiple complaints.  She reports body aches, headache, chills, fevers for over 2 days.  She also reports bumps in her axilla for 2 days. Denies cough, but she does have chest pain. She works at the [employer]. No other health conditions She reports she has had no taste recently PMH-none Soc hx - works at [employer]  Past Surgical History:  Procedure Laterality Date  . TUBAL LIGATION       OB History   No obstetric history on file.      Home Medications    Prior to Admission medications   Medication Sig Start Date End Date Taking? Authorizing Provider  cyclobenzaprine (FLEXERIL) 10 MG tablet Take 0.5-1 tablets (5-10 mg total) by mouth 2 (two) times daily as needed for muscle spasms. 09/22/18   Margarita Mail, PA-C  meloxicam (MOBIC) 15 MG tablet Take 1 tablet (15 mg total) by mouth daily. Take 1 daily with food. 09/22/18   Margarita Mail, PA-C    Family History No family history on file.  Social History Social History   Tobacco Use  . Smoking status: Never Smoker  . Smokeless tobacco: Never Used  Substance Use Topics  . Alcohol use: No  . Drug use: No     Allergies   Patient has no known allergies.   Review of Systems Review of Systems  Constitutional: Positive for chills and  fever.  HENT: Positive for ear pain and sore throat.   Respiratory: Negative for cough.   Cardiovascular: Positive for chest pain.  Gastrointestinal: Positive for nausea and vomiting.  Musculoskeletal: Positive for myalgias.  Neurological: Positive for headaches.  All other systems reviewed and are negative.    Physical Exam Updated Vital Signs BP 100/73 (BP Location: Left Arm)   Pulse (!) 121   Temp (!) 102.9 F (39.4 C) (Oral)   Resp 20   LMP 01/10/2019   SpO2 100%   Physical Exam  CONSTITUTIONAL: Well developed/well nourished, uncomfortable appearing HEAD: Normocephalic/atraumatic EYES: EOMI/PERRL ENMT: Mucous membranes moist NECK: supple no meningeal signs SPINE/BACK:entire spine nontender CV: S1/S2 noted, no murmurs/rubs/gallops noted LUNGS: Lungs are clear to auscultation bilaterally, no apparent distress, tachypneic ABDOMEN: soft, nontender, no rebound or guarding, bowel sounds noted throughout abdomen GU:no cva tenderness NEURO: Pt is awake/alert/appropriate, moves all extremitiesx4.  No facial droop.   EXTREMITIES: pulses normal/equal, full ROM Bilateral axillary lymphadenopathy, right > left, no abscess noted, nurse present for exam SKIN: warm, color normal PSYCH: no abnormalities of mood noted, alert and oriented to situation  ED Treatments / Results  Labs (all labs ordered are listed, but only abnormal results are displayed) Labs Reviewed  CBC WITH DIFFERENTIAL/PLATELET - Abnormal; Notable for the following components:      Result Value   Hemoglobin 9.8 (*)  HCT 33.0 (*)    MCH 24.1 (*)    MCHC 29.7 (*)    RDW 17.9 (*)    All other components within normal limits  COMPREHENSIVE METABOLIC PANEL - Abnormal; Notable for the following components:   Potassium 3.4 (*)    CO2 20 (*)    Glucose, Bld 103 (*)    Calcium 8.8 (*)    Alkaline Phosphatase 37 (*)    All other components within normal limits  URINALYSIS, ROUTINE W REFLEX MICROSCOPIC - Abnormal;  Notable for the following components:   APPearance HAZY (*)    Hgb urine dipstick MODERATE (*)    Ketones, ur 20 (*)    Protein, ur 30 (*)    All other components within normal limits  SARS CORONAVIRUS 2  LACTIC ACID, PLASMA  I-STAT BETA HCG BLOOD, ED ([facility], [facility], [facility] ONLY)    EKG    Radiology Dg Chest Port 1 View  Result Date: 01/14/2019 CLINICAL DATA:  Centralized body aches and chills for 3 days. Fever. EXAM: PORTABLE CHEST 1 VIEW COMPARISON:  Two-view chest x-ray 09/04/2015 FINDINGS: The heart size is normal. There is no edema or effusion. Lung volumes are low. The visualized soft tissues and bony thorax are unremarkable. IMPRESSION: 1. Low lung volumes. 2. No acute cardiopulmonary disease. Electronically Signed   By: San Morelle M.D.   On: 01/14/2019 04:58    Procedures Procedures    Medications Ordered in ED Medications  acetaminophen (TYLENOL) tablet 650 mg (650 mg Oral Given 01/14/19 0113)  sodium chloride 0.9 % bolus 1,000 mL (0 mLs Intravenous Stopped 01/14/19 0509)  ibuprofen (ADVIL) tablet 400 mg (400 mg Oral Given 01/14/19 0416)  sodium chloride 0.9 % bolus 1,000 mL (0 mLs Intravenous Stopped 01/14/19 0626)     Initial Impression / Assessment and Plan / ED Course  I have reviewed the triage vital signs and the nursing notes.  Pertinent labs & imaging results that were available during my care of the patient were reviewed by me and considered in my medical decision making (see chart for details).        7:17 AM Patient presented with fever, chills, myalgias, chest pain shortness of breath.  Rectal temp was 105.  Patient has now improved.  Initial COVID-19 test negative.  No obvious source of infection at this time, but overall she is well-appearing. No signs of meningitis.  She is nontoxic-appearing.  No signs of tickborne illness  She reported persistent right ear pain, on exam, mild erythema of TM, will treat for otitis media, but I doubt this is the source  of all of her symptoms. Advised her that her COVID-19 test may be a false negative. She will continue to quarantine, and if no improvement in the next 48 hours she should be reevaluated.  We discussed strict ER return precautions.   Paula Jarvis was evaluated in Emergency Department on 01/14/2019 for the symptoms described in the history of present illness. She was evaluated in the context of the global COVID-19 pandemic, which necessitated consideration that the patient might be at risk for infection with the SARS-CoV-2 virus that causes COVID-19. Institutional protocols and algorithms that pertain to the evaluation of patients at risk for COVID-19 are in a state of rapid change based on information released by regulatory bodies including the CDC and federal and state organizations. These policies and algorithms were followed during the patient's care in the ED.   Final Clinical Impressions(s) / ED Diagnoses   Final  diagnoses:  Influenza-like illness  Acute otitis media, unspecified otitis media type    ED Discharge Orders         Ordered    amoxicillin (AMOXIL) 500 MG capsule  3 times daily     01/14/19 6283           Ripley Fraise, MD 01/14/19 [phone]

## 2019-01-14 NOTE — ED Notes (Signed)
Attempted to collect urine sample from pt, unsuccessful.

## 2019-01-14 NOTE — ED Notes (Signed)
RECTAL TEMP 105.3. MD INFORMED.

## 2019-01-14 NOTE — ED Notes (Signed)
Pt given discharge instructions and follow up information. Pt given the opportunity to ask questions. Pt verbalized understanding. Pt IV removed.

## 2019-01-14 NOTE — Discharge Instructions (Signed)

## 2020-03-23 ENCOUNTER — Encounter (HOSPITAL_COMMUNITY): Payer: Self-pay | Admitting: Emergency Medicine

## 2020-03-23 ENCOUNTER — Emergency Department (HOSPITAL_COMMUNITY)
Admission: EM | Admit: 2020-03-23 | Discharge: 2020-03-23 | Disposition: A | Discharge status: Home / Self Care | Payer: Commercial Managed Care - PPO | Attending: Emergency Medicine | Admitting: Emergency Medicine

## 2020-03-23 ENCOUNTER — Emergency Department (HOSPITAL_COMMUNITY): Payer: Commercial Managed Care - PPO

## 2020-03-23 DIAGNOSIS — R0602 Shortness of breath: Secondary | ICD-10-CM | POA: Diagnosis present

## 2020-03-23 DIAGNOSIS — R0789 Other chest pain: Secondary | ICD-10-CM | POA: Insufficient documentation

## 2020-03-23 DIAGNOSIS — Z20822 Contact with and (suspected) exposure to covid-19: Secondary | ICD-10-CM | POA: Diagnosis not present

## 2020-03-23 LAB — BASIC METABOLIC PANEL
Anion gap: 7 (ref 5–15)
BUN: 10 mg/dL (ref 6–20)
CO2: 22 mmol/L (ref 22–32)
Calcium: 9 mg/dL (ref 8.9–10.3)
Chloride: 108 mmol/L (ref 98–111)
Creatinine, Ser: 0.69 mg/dL (ref 0.44–1.00)
GFR calc Af Amer: >60 mL/min (ref >60)
GFR calc non Af Amer: >60 mL/min (ref >60)
Glucose, Bld: 92 mg/dL (ref 70–99)
Potassium: 3.3 mmol/L — ABNORMAL LOW (ref 3.5–5.1)
Sodium: 137 mmol/L (ref 135–145)

## 2020-03-23 LAB — CBC WITH DIFFERENTIAL/PLATELET
Abs Immature Granulocytes: 0.01 10*3/uL (ref 0.00–0.07)
Basophils Absolute: 0 10*3/uL (ref 0.0–0.1)
Basophils Relative: 1 %
Eosinophils Absolute: 0.1 10*3/uL (ref 0.0–0.5)
Eosinophils Relative: 3 %
HCT: 32 % — ABNORMAL LOW (ref 36.0–46.0)
Hemoglobin: 9.4 g/dL — ABNORMAL LOW (ref 12.0–15.0)
Immature Granulocytes: 0 %
Lymphocytes Relative: 45 %
Lymphs Abs: 2.3 10*3/uL (ref 0.7–4.0)
MCH: 23.7 pg — ABNORMAL LOW (ref 26.0–34.0)
MCHC: 29.4 g/dL — ABNORMAL LOW (ref 30.0–36.0)
MCV: 80.6 fL (ref 80.0–100.0)
Monocytes Absolute: 0.4 10*3/uL (ref 0.1–1.0)
Monocytes Relative: 8 %
Neutro Abs: 2.1 10*3/uL (ref 1.7–7.7)
Neutrophils Relative %: 43 %
Platelets: 501 10*3/uL — ABNORMAL HIGH (ref 150–400)
RBC: 3.97 MIL/uL (ref 3.87–5.11)
RDW: 17.8 % — ABNORMAL HIGH (ref 11.5–15.5)
WBC: 5 10*3/uL (ref 4.0–10.5)
nRBC: 0 % (ref 0.0–0.2)

## 2020-03-23 LAB — D-DIMER, QUANTITATIVE: D-Dimer, Quant: <0.27 ug/mL-FEU (ref 0.00–0.50)

## 2020-03-23 LAB — SARS CORONAVIRUS 2 BY RT PCR (HOSPITAL ORDER, PERFORMED IN ~~LOC~~ HOSPITAL LAB): SARS Coronavirus 2: NEGATIVE

## 2020-03-23 IMAGING — CR DG CHEST 2V
2 series · 2 of 2 positions shown · non-contrast
Comparison: CT [DATE].  Chest x-ray [DATE].

CLINICAL DATA: Shortness of breath.  Chest pain.

EXAM:
CHEST - 2 VIEW

[w chest pa]
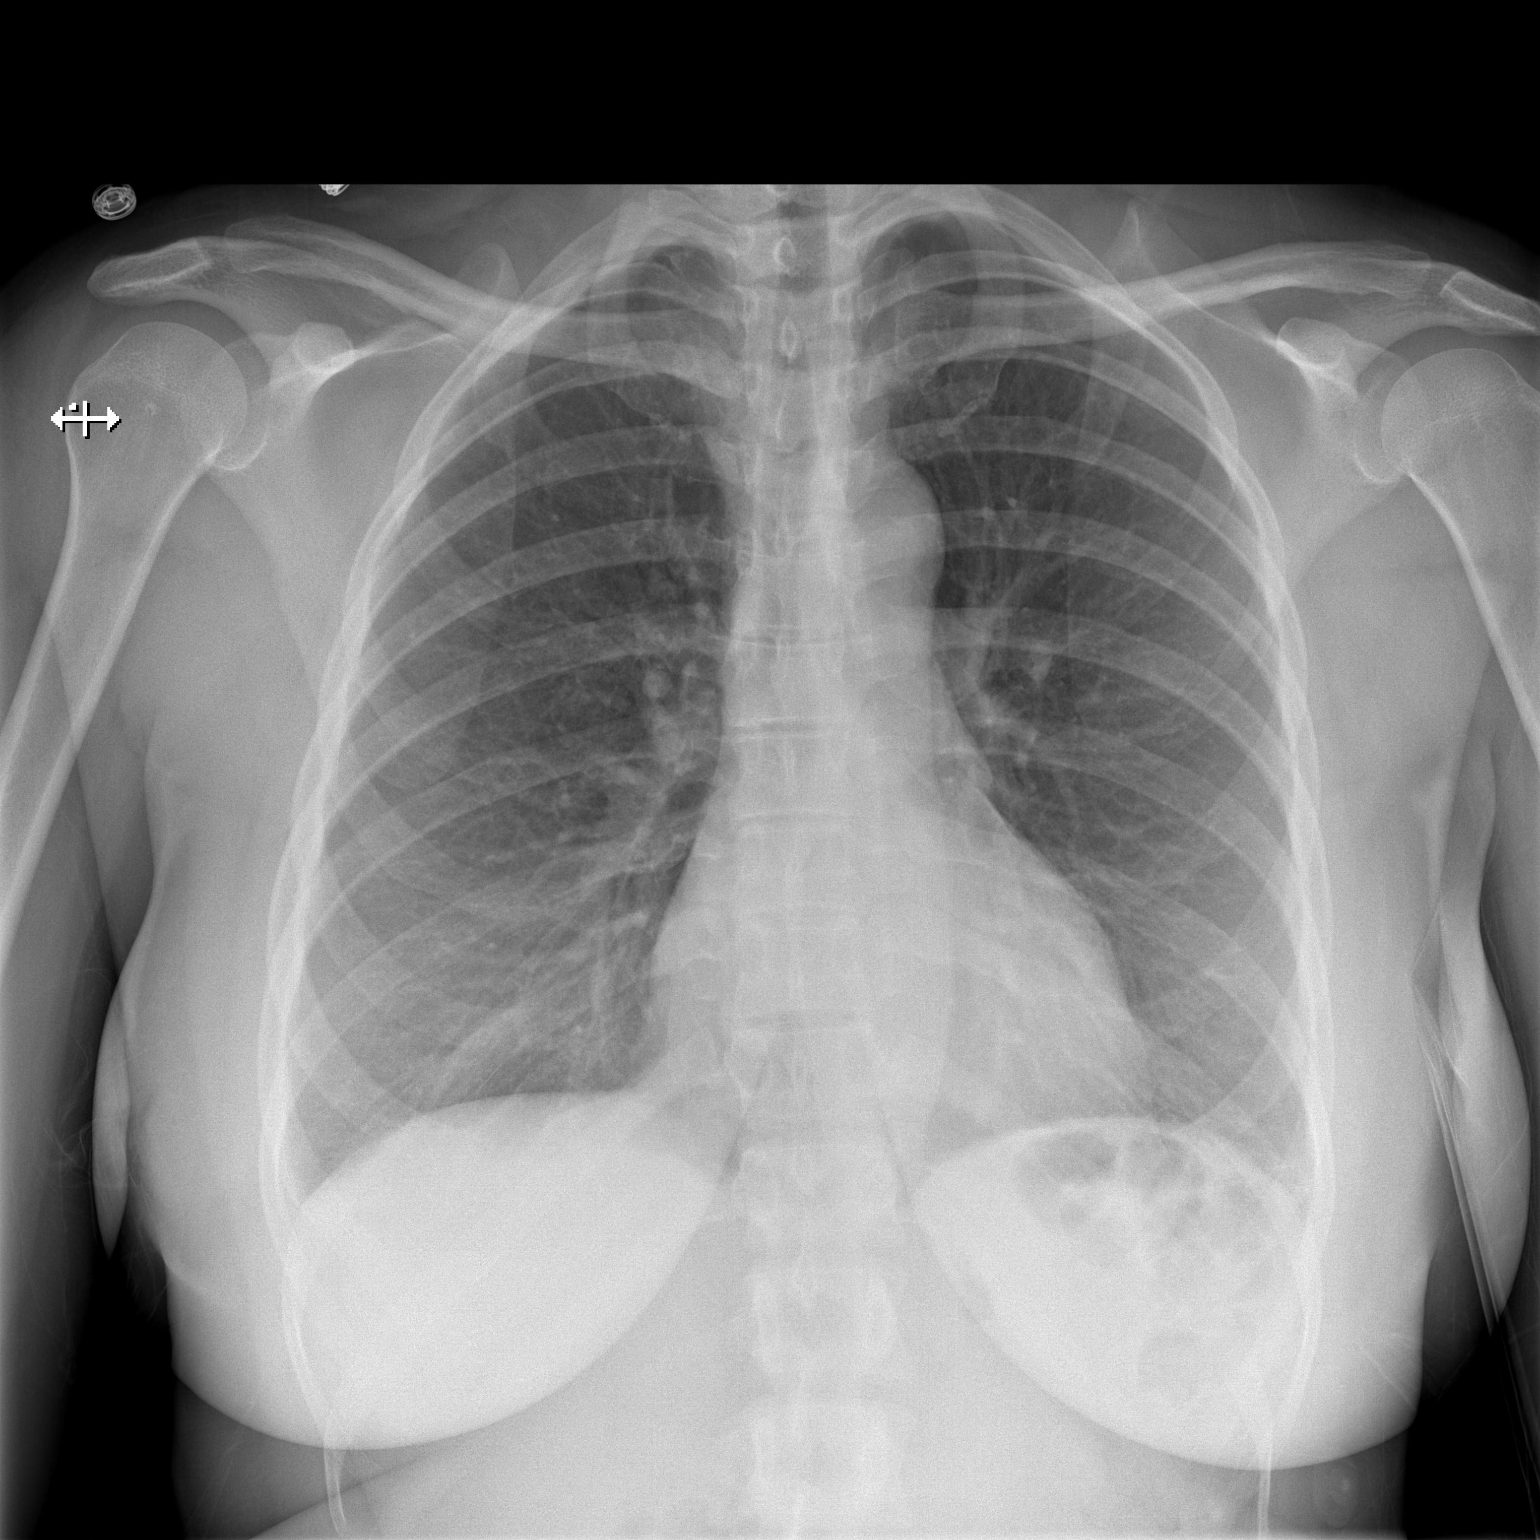

[w chest lat]
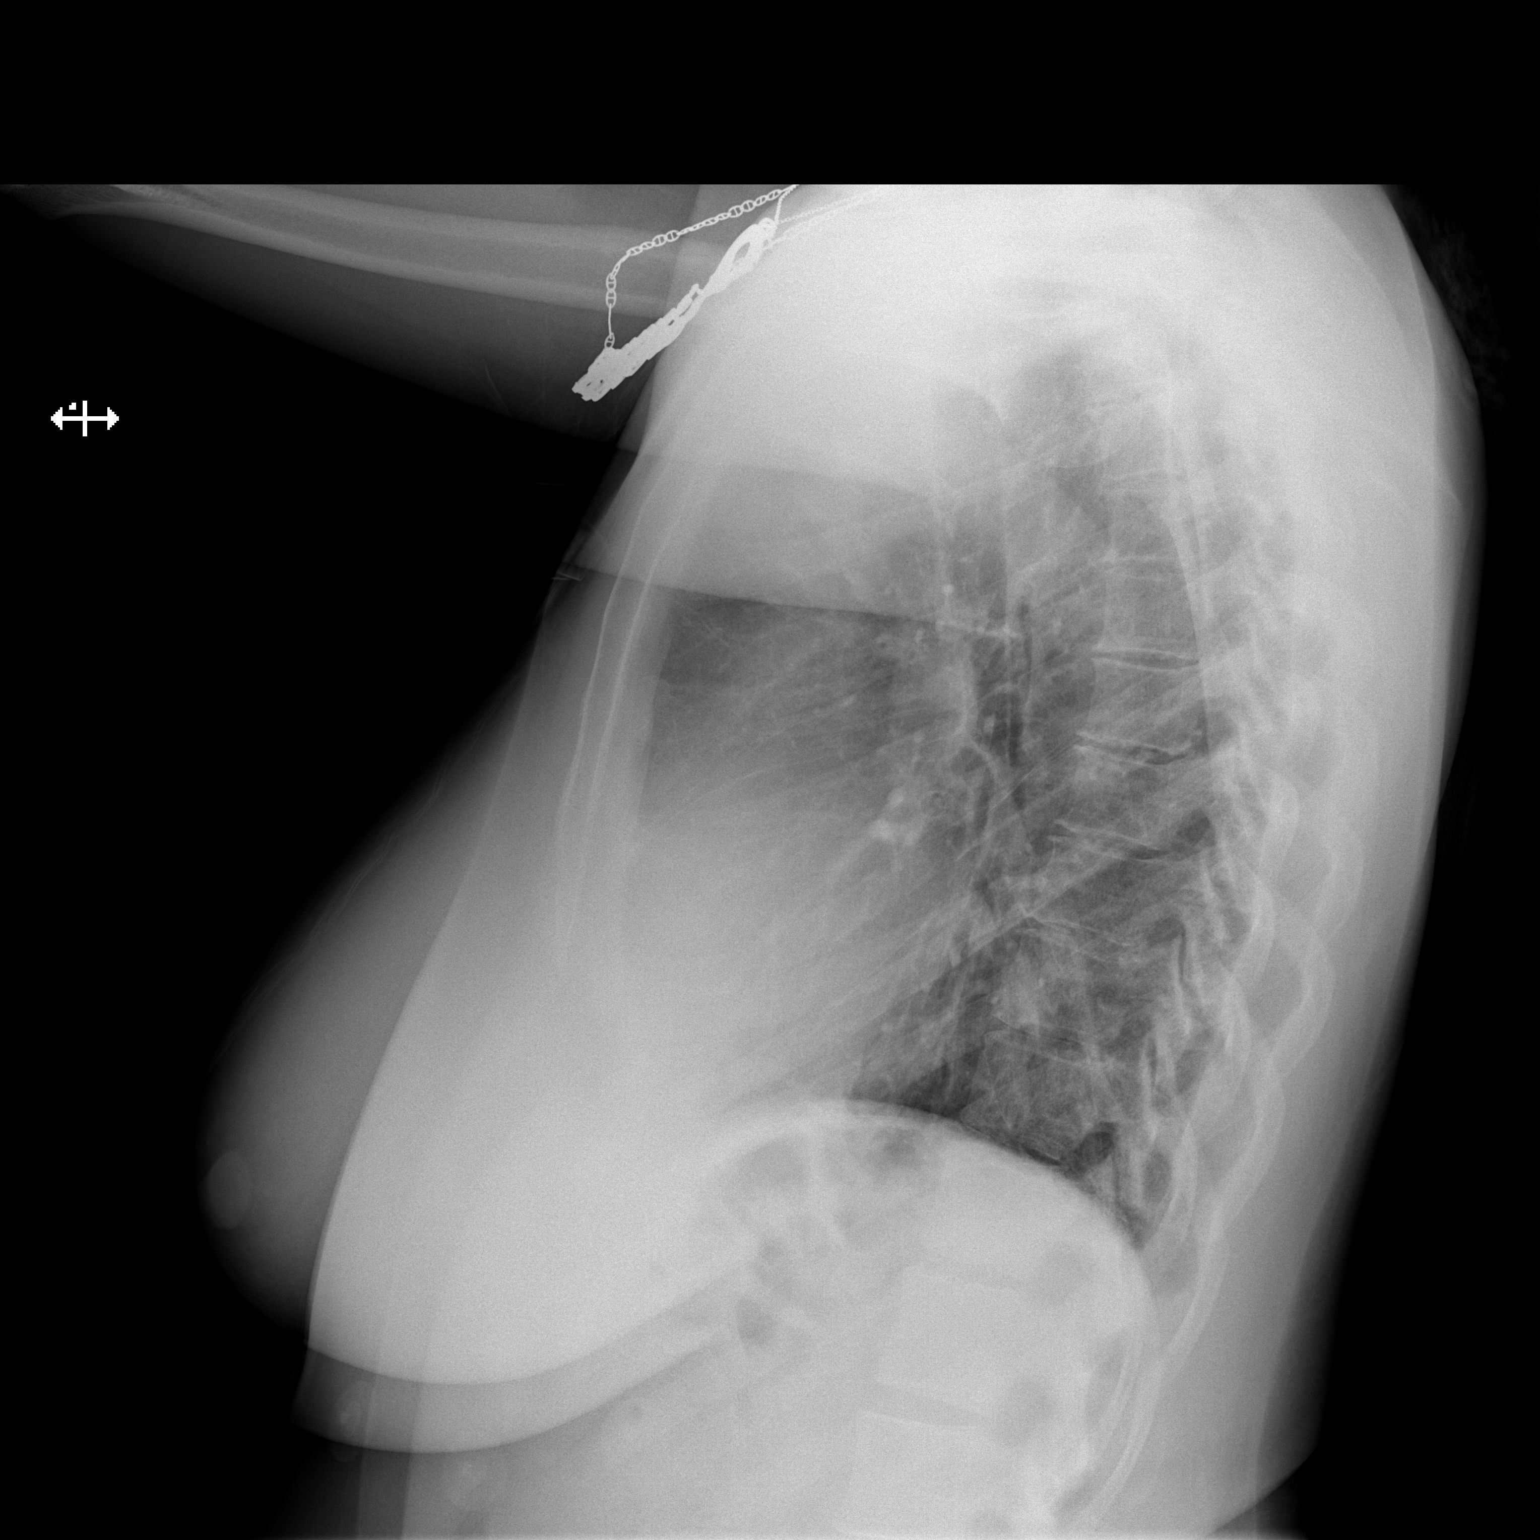

[2 of 2 positions shown; findings below may reference images not displayed]

FINDINGS: Mediastinum hilar structures normal. Mild bibasilar subsegmental
atelectasis. No pleural effusion or pneumothorax. Degenerative
change thoracic spine.
IMPRESSION: Mild bibasilar subsegmental atelectasis otherwise negative exam.

## 2020-03-23 MED ORDER — OMEPRAZOLE 20 MG PO CPDR: 20.0000 mg | DELAYED_RELEASE_CAPSULE | Freq: Two times a day (BID) | ORAL | 0 refills | Status: DC

## 2020-03-23 MED ORDER — MORPHINE SULFATE (PF) 4 MG/ML IV SOLN
4.0000 mg | Freq: Once | INTRAVENOUS | Status: AC
  Administered 2020-03-23: 4 mg via INTRAVENOUS
  Filled 2020-03-23: qty 1

## 2020-03-23 NOTE — Discharge Instructions (Signed)
You have been evaluated for your chest discomfort. Fortunately your COVID-19 test today is negative. No evidence to suggest that your chest pain is due to a life-threatening emergency. Take Prilosec twice daily before each meal as it may help with your symptoms. Call and follow-up closely with your primary care doctor for further care. Return to the ER if you have any concern.

## 2020-03-23 NOTE — ED Triage Notes (Signed)
Pt reports SOB starting last night, and has chest pain on inhalation. Pt also reports that her finger tips are tingling.

## 2020-03-23 NOTE — ED Provider Notes (Signed)
[facility] DEPT Provider Note   CSN: [id number] Arrival date & time: 03/23/20  1348     History Chief Complaint  Patient presents with  . Shortness of Breath  . Chest Pain    Paula Jarvis is a 44 y.o. female.  The history is provided by the patient. No language interpreter was used.  Shortness of Breath Associated symptoms: chest pain   Chest Pain Associated symptoms: shortness of breath      44 year old female with recent COVID-19 exposure presenting complaining of chest pain and shortness of breath.  Patient report developing pain to the left side of chest that started since last night and persisted throughout the day today.  She described pain as a sharp stabbing pain with some heaviness sensation primarily on the left side of her chest, nonradiating.  Pain is worse with taking deep breaths.  She endorsed having some shortness of breath, and also report noticing some tingling sensation to the fingertips of her right hand.  She does not complain of any fever or chills but endorses persistent sneezing.  No runny nose, sore throat, coughing, loss of taste or smell, abdominal pain, dysuria, hemoptysis.  Denies any leg swelling or calf pain.  No prior history of PE or DVT.  Patient mention her daughter tested positive for COVID-19 last week.  She also report her husband recently had an exposure to someone with COVID-19.  She mention been tested for COVID-19 several days ago and it was negative.  She denies tobacco use.  No recent travel and denies any heavy lifting.  Patient is unvaccinated for COVID-19.  History reviewed. No pertinent past medical history.  There are no problems to display for this patient.   Past Surgical History:  Procedure Laterality Date  . TUBAL LIGATION       OB History   No obstetric history on file.     History reviewed. No pertinent family history.  Social History   Tobacco Use  . Smoking status: Never Smoker  .  Smokeless tobacco: Never Used  Substance Use Topics  . Alcohol use: No  . Drug use: No    Home Medications Prior to Admission medications   Medication Sig Start Date End Date Taking? Authorizing Provider  amoxicillin (AMOXIL) 500 MG capsule Take 1 capsule (500 mg total) by mouth 3 (three) times daily. 01/14/19   Ripley Fraise, MD  cyclobenzaprine (FLEXERIL) 10 MG tablet Take 0.5-1 tablets (5-10 mg total) by mouth 2 (two) times daily as needed for muscle spasms. 09/22/18   Margarita Mail, PA-C  meloxicam (MOBIC) 15 MG tablet Take 1 tablet (15 mg total) by mouth daily. Take 1 daily with food. 09/22/18   Margarita Mail, PA-C    Allergies    Patient has no known allergies.  Review of Systems   Review of Systems  Respiratory: Positive for shortness of breath.   Cardiovascular: Positive for chest pain.  All other systems reviewed and are negative.   Physical Exam Updated Vital Signs BP 130/78 (BP Location: Left Arm)   Pulse 80   Temp 99 F (37.2 C) (Oral)   Resp 18   SpO2 100%   Physical Exam Vitals and nursing note reviewed.  Constitutional:      General: She is not in acute distress.    Appearance: She is well-developed.  HENT:     Head: Atraumatic.  Eyes:     Conjunctiva/sclera: Conjunctivae normal.  Cardiovascular:     Rate and Rhythm: Normal rate  and regular rhythm.  Pulmonary:     Effort: Pulmonary effort is normal.     Breath sounds: Normal breath sounds. No decreased breath sounds, wheezing, rhonchi or rales.  Chest:     Chest wall: Tenderness (Mild tenderness to palpation of left chest) present.  Abdominal:     Palpations: Abdomen is soft.     Tenderness: There is no abdominal tenderness.  Musculoskeletal:     Cervical back: Neck supple.     Right lower leg: No edema.     Left lower leg: No edema.  Skin:    Findings: No rash.  Neurological:     Mental Status: She is alert and oriented to person, place, and time.  Psychiatric:        Mood and Affect:  Mood normal.     ED Results / Procedures / Treatments   Labs (all labs ordered are listed, but only abnormal results are displayed) Labs Reviewed  BASIC METABOLIC PANEL - Abnormal; Notable for the following components:      Result Value   Potassium 3.3 (*)    All other components within normal limits  CBC WITH DIFFERENTIAL/PLATELET - Abnormal; Notable for the following components:   Hemoglobin 9.4 (*)    HCT 32.0 (*)    MCH 23.7 (*)    MCHC 29.4 (*)    RDW 17.8 (*)    Platelets 501 (*)    All other components within normal limits  SARS CORONAVIRUS 2 BY RT PCR (HOSPITAL ORDER, [facility])  D-DIMER, QUANTITATIVE (NOT AT [facility])    EKG None   Date: 03/23/2020  Rate: 56  Rhythm:sinus bradycardia  QRS Axis: normal  Intervals: normal  ST/T Wave abnormalities: normal  Conduction Disutrbances: none  Narrative Interpretation:   Old EKG Reviewed: No significant changes noted EKG reviewed and interpreted by me    Radiology DG Chest 2 View  Result Date: 03/23/2020 CLINICAL DATA:  Shortness of breath.  Chest pain. EXAM: CHEST - 2 VIEW COMPARISON:  CT 07/03/2016.  Chest x-ray 07/03/2016. FINDINGS: Mediastinum hilar structures normal. Mild bibasilar subsegmental atelectasis. No pleural effusion or pneumothorax. Degenerative change thoracic spine. IMPRESSION: Mild bibasilar subsegmental atelectasis otherwise negative exam. Electronically Signed   By: Marcello Moores  Register   On: 03/23/2020 15:45    Procedures Procedures (including critical care time)  Medications Ordered in ED Medications  morphine 4 MG/ML injection 4 mg (4 mg Intravenous Given 03/23/20 1900)    ED Course  I have reviewed the triage vital signs and the nursing notes.  Pertinent labs & imaging results that were available during my care of the patient were reviewed by me and considered in my medical decision making (see chart for details).    MDM Rules/Calculators/A&P                           BP 138/72 (BP Location: Left Arm)   Pulse (!) 59   Temp 99 F (37.2 C) (Oral)   Resp (!) 30   SpO2 100%   Final Clinical Impression(s) / ED Diagnoses Final diagnoses:  Atypical chest pain    Rx / DC Orders ED Discharge Orders         Ordered    omeprazole (PRILOSEC) 20 MG capsule  2 times daily before meals        03/23/20 2038         6:30 PM Patient here with pleuritic chest pain and  shortness of breath that started since yesterday.  Recent COVID-19 exposure.  Given her symptoms, work-up initiated, COVID-19 test ordered.  8:32 PM Patient has reassuring work-up. EKG without concerning ischemic changes. Doubt ACS. D-dimer is negative, low suspicion for PE. Mild hypokalemia with a potassium of 3.3. Her hemoglobin is 9.4. Her Covid test is negative. Her chest x-ray shows mild bibasilar subsegmental atelectasis but otherwise a very negative exam. At this time no acute emergent medical condition identified. Encourage patient to follow-up with primary care provider for further care. Return precaution discussed.  Khalaya Mcgurn was evaluated in Emergency Department on 03/23/2020 for the symptoms described in the history of present illness. She was evaluated in the context of the global COVID-19 pandemic, which necessitated consideration that the patient might be at risk for infection with the SARS-CoV-2 virus that causes COVID-19. Institutional protocols and algorithms that pertain to the evaluation of patients at risk for COVID-19 are in a state of rapid change based on information released by regulatory bodies including the CDC and federal and state organizations. These policies and algorithms were followed during the patient's care in the ED.    Domenic Moras, PA-C 03/23/20 2039    Lacretia Leigh, MD 03/27/20 [phone]

## 2020-09-23 ENCOUNTER — Emergency Department (HOSPITAL_COMMUNITY)
Admission: EM | Admit: 2020-09-23 | Discharge: 2020-09-23 | Disposition: A | Discharge status: Home / Self Care | Payer: Commercial Managed Care - PPO | Attending: Emergency Medicine | Admitting: Emergency Medicine

## 2020-09-23 ENCOUNTER — Encounter (HOSPITAL_COMMUNITY): Payer: Self-pay | Admitting: Emergency Medicine

## 2020-09-23 DIAGNOSIS — J069 Acute upper respiratory infection, unspecified: Secondary | ICD-10-CM | POA: Diagnosis not present

## 2020-09-23 DIAGNOSIS — J029 Acute pharyngitis, unspecified: Secondary | ICD-10-CM | POA: Diagnosis present

## 2020-09-23 DIAGNOSIS — D649 Anemia, unspecified: Secondary | ICD-10-CM | POA: Diagnosis not present

## 2020-09-23 DIAGNOSIS — Z20822 Contact with and (suspected) exposure to covid-19: Secondary | ICD-10-CM | POA: Diagnosis not present

## 2020-09-23 DIAGNOSIS — N898 Other specified noninflammatory disorders of vagina: Secondary | ICD-10-CM

## 2020-09-23 LAB — COMPREHENSIVE METABOLIC PANEL
ALT: 11 U/L (ref 0–44)
AST: 15 U/L (ref 15–41)
Albumin: 3.9 g/dL (ref 3.5–5.0)
Alkaline Phosphatase: 45 U/L (ref 38–126)
Anion gap: 8 (ref 5–15)
BUN: 9 mg/dL (ref 6–20)
CO2: 20 mmol/L — ABNORMAL LOW (ref 22–32)
Calcium: 8.5 mg/dL — ABNORMAL LOW (ref 8.9–10.3)
Chloride: 107 mmol/L (ref 98–111)
Creatinine, Ser: 0.7 mg/dL (ref 0.44–1.00)
GFR, Estimated: >60 mL/min (ref >60)
Glucose, Bld: 90 mg/dL (ref 70–99)
Potassium: 4 mmol/L (ref 3.5–5.1)
Sodium: 135 mmol/L (ref 135–145)
Total Bilirubin: 0.4 mg/dL (ref 0.3–1.2)
Total Protein: 7.5 g/dL (ref 6.5–8.1)

## 2020-09-23 LAB — CBC WITH DIFFERENTIAL/PLATELET
Abs Immature Granulocytes: 0.02 10*3/uL (ref 0.00–0.07)
Basophils Absolute: 0 10*3/uL (ref 0.0–0.1)
Basophils Relative: 1 %
Eosinophils Absolute: 0 10*3/uL (ref 0.0–0.5)
Eosinophils Relative: 1 %
HCT: 33.2 % — ABNORMAL LOW (ref 36.0–46.0)
Hemoglobin: 9.7 g/dL — ABNORMAL LOW (ref 12.0–15.0)
Immature Granulocytes: 0 %
Lymphocytes Relative: 20 %
Lymphs Abs: 1.2 10*3/uL (ref 0.7–4.0)
MCH: 23.5 pg — ABNORMAL LOW (ref 26.0–34.0)
MCHC: 29.2 g/dL — ABNORMAL LOW (ref 30.0–36.0)
MCV: 80.6 fL (ref 80.0–100.0)
Monocytes Absolute: 0.8 10*3/uL (ref 0.1–1.0)
Monocytes Relative: 15 %
Neutro Abs: 3.7 10*3/uL (ref 1.7–7.7)
Neutrophils Relative %: 63 %
Platelets: 432 10*3/uL — ABNORMAL HIGH (ref 150–400)
RBC: 4.12 MIL/uL (ref 3.87–5.11)
RDW: 18 % — ABNORMAL HIGH (ref 11.5–15.5)
WBC: 5.8 10*3/uL (ref 4.0–10.5)
nRBC: 0 % (ref 0.0–0.2)

## 2020-09-23 LAB — URINALYSIS, ROUTINE W REFLEX MICROSCOPIC
Bilirubin Urine: NEGATIVE
Glucose, UA: NEGATIVE mg/dL
Hgb urine dipstick: NEGATIVE
Ketones, ur: NEGATIVE mg/dL
Leukocytes,Ua: NEGATIVE
Nitrite: NEGATIVE
Protein, ur: NEGATIVE mg/dL
Specific Gravity, Urine: 1.019 (ref 1.005–1.030)
pH: 6 (ref 5.0–8.0)

## 2020-09-23 LAB — WET PREP, GENITAL
Clue Cells Wet Prep HPF POC: NONE SEEN
Sperm: NONE SEEN
Trich, Wet Prep: NONE SEEN
Yeast Wet Prep HPF POC: NONE SEEN

## 2020-09-23 LAB — RESP PANEL BY RT-PCR (FLU A&B, COVID) ARPGX2
Influenza A by PCR: NEGATIVE
Influenza B by PCR: NEGATIVE
SARS Coronavirus 2 by RT PCR: NEGATIVE

## 2020-09-23 LAB — I-STAT BETA HCG BLOOD, ED (MC, WL, AP ONLY): I-stat hCG, quantitative: <5 m[IU]/mL (ref <5)

## 2020-09-23 NOTE — Discharge Instructions (Addendum)
There is no specific cause found for your respiratory symptoms and pelvic discomfort.  Make sure you are getting plenty of rest and drink more fluids.  For sore throat, or fever, use Tylenol.  For the perineal discomfort, soak in a warm tub and cleanse your perineal area with a mild soap, making sure you rinse well afterwards.  Avoid adding additional creams or topical treatments.  Vaginal discharge will likely improve in the next few days.  We have some test pending which may need to be addressed if they return is positive.  We will contact you if that happens.  Otherwise, follow-up with your primary care doctor for further evaluation and treatment if not better in a few days.

## 2020-09-23 NOTE — ED Triage Notes (Signed)
Patient reports abdominal pain, sore throat, nausea, and fever x2 days. Also reports vaginal irritation.

## 2020-09-23 NOTE — ED Notes (Signed)
Pelvic Cart and

## 2020-09-23 NOTE — ED Provider Notes (Signed)
[facility] DEPT Provider Note   CSN: [id number] Arrival date & time: 09/23/20  1643     History Chief Complaint  Patient presents with  . Abdominal Pain  . Generalized Body Aches  . Sore Throat    Paula Jarvis is a 45 y.o. female.  HPI She presents for evaluation of upper respiratory symptoms including rhinorrhea, sneezing and sore throat, started yesterday.  She also complains of soreness of the outside of her perineum and painful urination.  She denies fever, chills, weakness or dizziness.  She has not had COVID vaccines.  She states she did a Covid test yesterday at home and it was normal.  There are no other known active modifying factors.  There are no other known active modifying factors.    History reviewed. No pertinent past medical history.  There are no problems to display for this patient.   Past Surgical History:  Procedure Laterality Date  . TUBAL LIGATION       OB History   No obstetric history on file.     No family history on file.  Social History   Tobacco Use  . Smoking status: Never Smoker  . Smokeless tobacco: Never Used  Substance Use Topics  . Alcohol use: No  . Drug use: No    Home Medications Prior to Admission medications   Medication Sig Start Date End Date Taking? Authorizing Provider  amoxicillin (AMOXIL) 500 MG capsule Take 1 capsule (500 mg total) by mouth 3 (three) times daily. Patient not taking: No sig reported 01/14/19   Ripley Fraise, MD  cyclobenzaprine (FLEXERIL) 10 MG tablet Take 0.5-1 tablets (5-10 mg total) by mouth 2 (two) times daily as needed for muscle spasms. Patient not taking: No sig reported 09/22/18   Margarita Mail, PA-C  meloxicam (MOBIC) 15 MG tablet Take 1 tablet (15 mg total) by mouth daily. Take 1 daily with food. Patient not taking: No sig reported 09/22/18   Margarita Mail, PA-C  omeprazole (PRILOSEC) 20 MG capsule Take 1 capsule (20 mg total) by mouth 2 (two) times  daily before a meal. Patient not taking: No sig reported 03/23/20   Domenic Moras, PA-C    Allergies    Patient has no known allergies.  Review of Systems   Review of Systems  All other systems reviewed and are negative.   Physical Exam Updated Vital Signs BP 135/77   Pulse 82   Temp 99.1 F (37.3 C) (Oral)   Resp 20   LMP 08/13/2020   SpO2 99%     Physical Exam Vitals and nursing note reviewed.  Constitutional:      General: She is not in acute distress.    Appearance: She is well-developed and well-nourished. She is not ill-appearing, toxic-appearing or diaphoretic.  HENT:     Head: Normocephalic and atraumatic.     Right Ear: External ear normal.     Left Ear: External ear normal.     Mouth/Throat:     Mouth: Mucous membranes are moist.     Pharynx: No oropharyngeal exudate or posterior oropharyngeal erythema.  Eyes:     Extraocular Movements: EOM normal.     Conjunctiva/sclera: Conjunctivae normal.     Pupils: Pupils are equal, round, and reactive to light.  Neck:     Trachea: Phonation normal.  Cardiovascular:     Rate and Rhythm: Normal rate and regular rhythm.     Heart sounds: Normal heart sounds.  Pulmonary:     Effort:  Pulmonary effort is normal. No respiratory distress.     Breath sounds: Normal breath sounds. No stridor.  Chest:     Chest wall: No bony tenderness.  Abdominal:     General: There is no distension.     Palpations: Abdomen is soft.     Tenderness: There is no abdominal tenderness.  Genitourinary:    Comments: External female genitalia.  No visible perineal rash.  Small amount of green opaque discharge in the vaginal vault.  No clear cervical discharge.  No cervical bleeding.  No cervical motion tenderness.  Minimal midline pelvic tenderness on bimanual examination without palpable organomegaly. Musculoskeletal:        General: Normal range of motion.     Cervical back: Normal range of motion and neck supple.  Skin:    General: Skin is  warm, dry and intact.  Neurological:     Mental Status: She is alert and oriented to person, place, and time.     Cranial Nerves: No cranial nerve deficit.     Sensory: No sensory deficit.     Motor: No abnormal muscle tone.     Coordination: Coordination normal.  Psychiatric:        Mood and Affect: Mood and affect and mood normal.        Behavior: Behavior normal.        Thought Content: Thought content normal.        Judgment: Judgment normal.     ED Results / Procedures / Treatments   Labs (all labs ordered are listed, but only abnormal results are displayed) Labs Reviewed  COMPREHENSIVE METABOLIC PANEL - Abnormal; Notable for the following components:      Result Value   CO2 20 (*)    Calcium 8.5 (*)    All other components within normal limits  CBC WITH DIFFERENTIAL/PLATELET - Abnormal; Notable for the following components:   Hemoglobin 9.7 (*)    HCT 33.2 (*)    MCH 23.5 (*)    MCHC 29.2 (*)    RDW 18.0 (*)    Platelets 432 (*)    All other components within normal limits  RESP PANEL BY RT-PCR (FLU A&B, COVID) ARPGX2  WET PREP, GENITAL  URINALYSIS, ROUTINE W REFLEX MICROSCOPIC  RPR  HIV ANTIBODY (ROUTINE TESTING W REFLEX)  I-STAT BETA HCG BLOOD, ED ([facility], [facility], [facility] ONLY)  GC/CHLAMYDIA PROBE AMP ([place]) NOT AT [facility]    EKG None  Radiology No results found.  Procedures Procedures   Medications Ordered in ED Medications - No data to display  ED Course  I have reviewed the triage vital signs and the nursing notes.  Pertinent labs & imaging results that were available during my care of the patient were reviewed by me and considered in my medical decision making (see chart for details).    MDM Rules/Calculators/A&P                           Patient Vitals for the past 24 hrs:  BP Temp Temp src Pulse Resp SpO2  09/23/20 1945 135/77 -- -- 82 20 99 %  09/23/20 1930 137/82 -- -- 78 18 99 %  09/23/20 1915 135/82 -- -- 77 19 98 %  09/23/20 1900 135/87 --  -- 83 17 97 %  09/23/20 1845 130/74 -- -- 81 11 99 %  09/23/20 1830 127/68 -- -- 82 12 98 %  09/23/20 1815 121/66 -- -- 83 20  99 %  09/23/20 1800 131/77 -- -- 79 16 98 %  09/23/20 1748 135/75 -- -- 82 10 99 %  09/23/20 1730 (!) 141/88 -- -- 84 18 97 %  09/23/20 1715 138/84 -- -- 82 20 97 %  09/23/20 1710 (!) 141/84 99.1 F (37.3 C) Oral 85 14 97 %  09/23/20 1649 140/76 98.8 F (37.1 C) Oral 93 16 96 %    9:28 PM Reevaluation with update and discussion. After initial assessment and treatment, an updated evaluation reveals no further complaints.  She is comfortable.  Findings discussed and questions answered. Daleen Bo   Medical Decision Making:  This patient is presenting for evaluation of URI symptoms and pelvic symptoms, which does require a range of treatment options, and is a complaint that involves a moderate risk of morbidity and mortality. The differential diagnoses include URI, UTI, pelvic infection. I decided to review old records, and in summary Ehly female presenting with diffuse symptoms, of nonspecific nature.  This requires evaluation.  I did not require additional historical information from anyone.  Clinical Laboratory Tests Ordered, included CBC, Metabolic panel, Urinalysis and Covid test, influenza test, pregnancy test, wet prep, STD testing. Review indicates normal findings, except anemia, stable at baseline.   Critical Interventions-clinical evaluation, laboratory testing, observation and reassessment  After These Interventions, the Patient was reevaluated and was found stable for discharge.  Nonspecific symptoms with reassuring evaluation.  Symptomatic care indicated at this time.  No indication for prescription medication treatment.  CRITICAL CARE-no Performed by: Daleen Bo  Nursing Notes Reviewed/ Care Coordinated Applicable Imaging Reviewed Interpretation of Laboratory Data incorporated into ED treatment  The patient appears reasonably screened  and/or stabilized for discharge and I doubt any other medical condition or other [facility] requiring further screening, evaluation, or treatment in the ED at this time prior to discharge.  Plan: Home Medications-symptomatic care; Home Treatments-rest, fluids, perineal care; return here if the recommended treatment, does not improve the symptoms; Recommended follow up-PCP, PR     Final Clinical Impression(s) / ED Diagnoses Final diagnoses:  Viral upper respiratory tract infection  Vaginal discharge  Anemia, unspecified type    Rx / DC Orders ED Discharge Orders    None       Daleen Bo, MD 09/23/20 2135

## 2020-09-24 LAB — RPR: RPR Ser Ql: NONREACTIVE

## 2020-09-24 LAB — HIV ANTIBODY (ROUTINE TESTING W REFLEX): HIV Screen 4th Generation wRfx: NONREACTIVE

## 2020-09-25 LAB — GC/CHLAMYDIA PROBE AMP (~~LOC~~) NOT AT ARMC
Chlamydia: NEGATIVE
Comment: NEGATIVE
Comment: NORMAL
Neisseria Gonorrhea: NEGATIVE

## 2021-06-27 ENCOUNTER — Emergency Department (HOSPITAL_COMMUNITY)
Admission: EM | Admit: 2021-06-27 | Discharge: 2021-06-28 | Disposition: A | Discharge status: Home / Self Care | Payer: Commercial Managed Care - PPO | Attending: Physician Assistant | Admitting: Physician Assistant

## 2021-06-27 ENCOUNTER — Encounter (HOSPITAL_COMMUNITY): Payer: Self-pay | Admitting: Emergency Medicine

## 2021-06-27 DIAGNOSIS — Z5321 Procedure and treatment not carried out due to patient leaving prior to being seen by health care provider: Secondary | ICD-10-CM | POA: Insufficient documentation

## 2021-06-27 DIAGNOSIS — M25552 Pain in left hip: Secondary | ICD-10-CM | POA: Diagnosis not present

## 2021-06-27 DIAGNOSIS — M79605 Pain in left leg: Secondary | ICD-10-CM | POA: Diagnosis present

## 2021-06-27 HISTORY — DX: Thrombocytosis, unspecified: D75.839

## 2021-06-27 LAB — CBC WITH DIFFERENTIAL/PLATELET
Abs Immature Granulocytes: 0.02 10*3/uL (ref 0.00–0.07)
Basophils Absolute: 0 10*3/uL (ref 0.0–0.1)
Basophils Relative: 1 %
Eosinophils Absolute: 0.1 10*3/uL (ref 0.0–0.5)
Eosinophils Relative: 3 %
HCT: 31.7 % — ABNORMAL LOW (ref 36.0–46.0)
Hemoglobin: 9.3 g/dL — ABNORMAL LOW (ref 12.0–15.0)
Immature Granulocytes: 0 %
Lymphocytes Relative: 41 %
Lymphs Abs: 2 10*3/uL (ref 0.7–4.0)
MCH: 24.9 pg — ABNORMAL LOW (ref 26.0–34.0)
MCHC: 29.3 g/dL — ABNORMAL LOW (ref 30.0–36.0)
MCV: 84.8 fL (ref 80.0–100.0)
Monocytes Absolute: 0.4 10*3/uL (ref 0.1–1.0)
Monocytes Relative: 9 %
Neutro Abs: 2.1 10*3/uL (ref 1.7–7.7)
Neutrophils Relative %: 46 %
Platelets: 377 10*3/uL (ref 150–400)
RBC: 3.74 MIL/uL — ABNORMAL LOW (ref 3.87–5.11)
RDW: 18.6 % — ABNORMAL HIGH (ref 11.5–15.5)
WBC: 4.7 10*3/uL (ref 4.0–10.5)
nRBC: 0 % (ref 0.0–0.2)

## 2021-06-27 LAB — BASIC METABOLIC PANEL
Anion gap: 5 (ref 5–15)
BUN: 11 mg/dL (ref 6–20)
CO2: 23 mmol/L (ref 22–32)
Calcium: 8.8 mg/dL — ABNORMAL LOW (ref 8.9–10.3)
Chloride: 110 mmol/L (ref 98–111)
Creatinine, Ser: 0.68 mg/dL (ref 0.44–1.00)
GFR, Estimated: >60 mL/min (ref >60)
Glucose, Bld: 95 mg/dL (ref 70–99)
Potassium: 3.8 mmol/L (ref 3.5–5.1)
Sodium: 138 mmol/L (ref 135–145)

## 2021-06-27 LAB — I-STAT BETA HCG BLOOD, ED (MC, WL, AP ONLY): I-stat hCG, quantitative: <5 m[IU]/mL (ref <5)

## 2021-06-27 LAB — CK: Total CK: 145 U/L (ref 38–234)

## 2021-06-27 LAB — MAGNESIUM: Magnesium: 2.1 mg/dL (ref 1.7–2.4)

## 2021-06-27 NOTE — ED Provider Notes (Signed)
Emergency Medicine Provider Triage Evaluation Note  Paula Jarvis , a 45 y.o. female  was evaluated in triage.  Pt complains of left leg pain and inability to move the lle. States she initially had paresthesias and weakness to the ble but now it is just in the left leg.  Review of Systems  Positive: Leg pain, weakness Negative: Fevers, chest pain, pain  Physical Exam  BP 135/77   Pulse 70   Temp 98.4 F (36.9 C) (Oral)   Resp 16   SpO2 100%  Gen:   Awake, no distress   Resp:  Normal effort  MSK:   Moves extremities without difficulty  Other:    Medical Decision Making  Medically screening exam initiated at 10:04 PM.  Appropriate orders placed.  Paula Jarvis was informed that the remainder of the evaluation will be completed by another provider, this initial triage assessment does not replace that evaluation, and the importance of remaining in the ED until their evaluation is complete.    Bishop Dublin 06/27/21 2205    Lacretia Leigh, MD 06/28/21 [phone]

## 2021-06-27 NOTE — ED Triage Notes (Signed)
Pt was seen at [facility] for c/o left hip swelling ongoing since Sat. She reports waking up on Saturday unable to move legs. Right leg fine now but left leg is swollen, right hip tender to touch, weakness and pain with rad to back of knee. Denies any injury.  Hx of thrombocytosis

## 2021-06-28 NOTE — ED Notes (Signed)
Patient did not respond for updated vitals

## 2021-06-28 NOTE — ED Notes (Signed)
Patient did not respond for updated vitals x2

## 2021-07-02 ENCOUNTER — Other Ambulatory Visit: Payer: Self-pay

## 2021-07-02 ENCOUNTER — Ambulatory Visit: Payer: Commercial Managed Care - PPO | Admitting: Physician Assistant

## 2021-07-02 ENCOUNTER — Encounter: Payer: Self-pay | Admitting: Physician Assistant

## 2021-07-02 VITALS — BP 134/86 | HR 82 | Temp 97.5°F | Ht 69.0 in | Wt 209.0 lb

## 2021-07-02 DIAGNOSIS — R200 Anesthesia of skin: Secondary | ICD-10-CM | POA: Diagnosis not present

## 2021-07-02 DIAGNOSIS — Z7689 Persons encountering health services in other specified circumstances: Secondary | ICD-10-CM | POA: Diagnosis not present

## 2021-07-02 DIAGNOSIS — M7989 Other specified soft tissue disorders: Secondary | ICD-10-CM

## 2021-07-02 DIAGNOSIS — M25552 Pain in left hip: Secondary | ICD-10-CM

## 2021-07-02 MED ORDER — GABAPENTIN 100 MG PO CAPS: 100.0000 mg | ORAL_CAPSULE | Freq: Three times a day (TID) | ORAL | 0 refills | Status: DC

## 2021-07-02 NOTE — Progress Notes (Signed)
New Patient Office Visit  Subjective:  Patient ID: Paula Jarvis, female    DOB: Oct 16, 1975  Age: 45 y.o. MRN: [id number]  CC: No chief complaint on file.   HPI Shelanda Duvall presents to establish care. Patient has c/o left hip pain and swelling over left thigh. Patient reports symptoms initially started as numbness and inability to move her legs two weekends ago. Just woke up Saturday morning like that. Denies injury or fall. States numbness gradually improved and then developed severe pain over her upper left leg which has moved to her hip area. Also reports burning sensation of left knee. Sitting predominantly at the right side because putting pressure of left side exacerbates her pain. Reports went to urgent care last week and was advised going to the ED for further evaluation including doppler ultrasound to r/o blood clot due to swelling of lateral left thigh. Patient reports after waiting for 10 hours at the ED left without being seen. Denies chest pain, shortness of breath, fever, dizziness, syncope, bladder or bowel dysfunction, new meds or supplements. Reports nausea that started this morning.    Past Medical History:  Diagnosis Date   Thrombocytosis     Past Surgical History:  Procedure Laterality Date   TUBAL LIGATION      History reviewed. No pertinent family history.  Social History   Socioeconomic History   Marital status: Married    Spouse name: Not on file   Number of children: Not on file   Years of education: Not on file   Highest education level: Not on file  Occupational History   Not on file  Tobacco Use   Smoking status: Never   Smokeless tobacco: Never  Substance and Sexual Activity   Alcohol use: No   Drug use: No   Sexual activity: Not on file  Other Topics Concern   Not on file  Social History Narrative   Not on file   Social Determinants of Health   Financial Resource Strain: Not on file  Food Insecurity: Not on file  Transportation  Needs: Not on file  Physical Activity: Not on file  Stress: Not on file  Social Connections: Not on file  Intimate Partner Violence: Not on file    ROS Review of Systems Review of Systems:  A fourteen system review of systems was performed and found to be positive as per HPI.  Objective:   Today's Vitals: BP 134/86   Pulse 82   Temp (!) 97.5 F (36.4 C)   Ht '5\' 9"'  (1.753 m)   Wt 209 lb (94.8 kg)   SpO2 99%   BMI 30.86 kg/m   Physical Exam General:  Well Developed, well nourished, appropriate for stated age.  Neuro:  Alert and oriented,  extra-ocular muscles intact  HEENT:  Normocephalic, atraumatic, neck supple, no carotid bruits appreciated  Skin:  no gross rash, warm, pink. Cardiac:  RRR, S1 S2 Respiratory:  CTA B/L, Not using accessory muscles, speaking in full sentences- unlabored. MSK: No midline or low back tenderness, exquisite tenderness to light palpation of left thigh, posterior knee, gluteus maximus and medius and greater trochanter. Antalgic gait.   Vascular:  Ext warm, no cyanosis apprec.; cap RF less 2 sec. Psych:  No HI/SI, judgement and insight good, Euthymic mood. Full Affect.  Assessment & Plan:   Problem List Items Addressed This Visit   None Visit Diagnoses     Left hip pain    -  Primary   Relevant Medications  gabapentin (NEURONTIN) 100 MG capsule   Other Relevant Orders   ANA w/Reflex if Positive   Rheumatoid Factor   B12 and Folate Panel   Comp Met (CMET)   Sedimentation rate   C-reactive protein   Numbness of lower extremity       Relevant Medications   gabapentin (NEURONTIN) 100 MG capsule   Other Relevant Orders   ANA w/Reflex if Positive   Rheumatoid Factor   B12 and Folate Panel   Comp Met (CMET)   Sedimentation rate   C-reactive protein   Swelling of lower extremity       Relevant Orders   VAS [place] LOWER EXTREMITY VENOUS (DVT)      Etiology of symptoms unclear. Reviewed ED notes and labs. CK, potassium and magnesium  normal. Calcium mildly low at 8.8. Reviewed CBC w/diff including prior results which shows chronic normocytic anemia, recommend further evaluation in the near future (pt denies prior work-up). Will obtain additional labs to evaluate for inflammatory process or nutritional deficiency. Discussed symptoms are suggestive of nerve etiology such as compression/neuropathy/sciatica, symptoms follow L3-L4 and L4-L5 dermatomes. Will start gabapentin 100 mg TID. Discussed potential side effects including drowsiness. Advised can take 200 mg if 100 mg ineffective. If symptoms fail to improve or worsen will consider corticosteroid therapy. Will place order for stat doppler ultrasound to r/o DVT due to localized swelling and tenderness of left lateral thigh. Monitor for worsening symptoms or red flag s/sx and recommend to seek immediate medical care. Will reassess symptoms and medication therapy in 1 week.    Outpatient Encounter Medications as of 07/02/2021  Medication Sig   gabapentin (NEURONTIN) 100 MG capsule Take 1 capsule (100 mg total) by mouth 3 (three) times daily.   [DISCONTINUED] amoxicillin (AMOXIL) 500 MG capsule Take 1 capsule (500 mg total) by mouth 3 (three) times daily. (Patient not taking: No sig reported)   [DISCONTINUED] cyclobenzaprine (FLEXERIL) 10 MG tablet Take 0.5-1 tablets (5-10 mg total) by mouth 2 (two) times daily as needed for muscle spasms. (Patient not taking: No sig reported)   [DISCONTINUED] meloxicam (MOBIC) 15 MG tablet Take 1 tablet (15 mg total) by mouth daily. Take 1 daily with food. (Patient not taking: No sig reported)   [DISCONTINUED] omeprazole (PRILOSEC) 20 MG capsule Take 1 capsule (20 mg total) by mouth 2 (two) times daily before a meal. (Patient not taking: No sig reported)   No facility-administered encounter medications on file as of 07/02/2021.    Follow-up: Return in about 1 week (around 07/09/2021) for hip pain, lower extremitynumbness.   Lorrene Reid, PA-C

## 2021-07-04 ENCOUNTER — Telehealth: Payer: Self-pay | Admitting: Physician Assistant

## 2021-07-04 NOTE — Telephone Encounter (Signed)
Patient called and said she has not heard anything from [facility] regarding her ultrasound and she would like to go to [facility] off of [address] and the phone number [phone] if you can send the order there. Please advise. [phone]

## 2021-07-04 NOTE — Telephone Encounter (Signed)
Patient has been scheduled for 830am at [facility] in at main desk. Patient is aware and verbalized understanding. AS, CMA

## 2021-07-05 ENCOUNTER — Telehealth: Payer: Self-pay | Admitting: Physician Assistant

## 2021-07-05 ENCOUNTER — Other Ambulatory Visit: Payer: Self-pay

## 2021-07-05 ENCOUNTER — Ambulatory Visit (HOSPITAL_COMMUNITY)
Admission: RE | Admit: 2021-07-05 | Discharge: 2021-07-05 | Disposition: A | Discharge status: Home / Self Care | Payer: Commercial Managed Care - PPO | Source: Ambulatory Visit | Attending: Physician Assistant | Admitting: Physician Assistant

## 2021-07-05 DIAGNOSIS — R200 Anesthesia of skin: Secondary | ICD-10-CM

## 2021-07-05 DIAGNOSIS — M7989 Other specified soft tissue disorders: Secondary | ICD-10-CM | POA: Insufficient documentation

## 2021-07-05 DIAGNOSIS — M25552 Pain in left hip: Secondary | ICD-10-CM

## 2021-07-05 MED ORDER — METHYLPREDNISOLONE 4 MG PO TBPK: ORAL_TABLET | ORAL | 1 refills | Status: DC

## 2021-07-05 NOTE — Progress Notes (Signed)
Left lower extremity venous duplex has been completed. Preliminary results can be found in CV Proc through chart review.  Results were given to Lorrene Reid PA.  07/05/21 9:10 AM Carlos Levering RVT

## 2021-07-05 NOTE — Telephone Encounter (Signed)
Patient is aware US doppler negative for DVT. Patient still experiencing pain and swelling in her leg. She states the Gabapentin is too much medication and is requesting something different.   Per Herb Grays advised patient to d/c Gabapentin and send in Medrol Dose pack #48. Patient to use Ibuprofen for pain. Placing referral to Ortho.  Patient verbalized understanding and was agreeable. AS,CMA

## 2021-07-06 ENCOUNTER — Other Ambulatory Visit: Payer: Commercial Managed Care - PPO

## 2021-07-09 ENCOUNTER — Other Ambulatory Visit: Payer: Self-pay

## 2021-07-09 ENCOUNTER — Other Ambulatory Visit: Payer: Commercial Managed Care - PPO

## 2021-07-10 ENCOUNTER — Ambulatory Visit: Payer: Commercial Managed Care - PPO | Admitting: Orthopaedic Surgery

## 2021-07-10 ENCOUNTER — Encounter: Payer: Self-pay | Admitting: Physician Assistant

## 2021-07-10 ENCOUNTER — Other Ambulatory Visit: Payer: Self-pay | Admitting: Physician Assistant

## 2021-07-10 DIAGNOSIS — R42 Dizziness and giddiness: Secondary | ICD-10-CM

## 2021-07-10 DIAGNOSIS — R200 Anesthesia of skin: Secondary | ICD-10-CM

## 2021-07-10 LAB — COMPREHENSIVE METABOLIC PANEL
ALT: 10 IU/L (ref 0–32)
AST: 11 IU/L (ref 0–40)
Albumin/Globulin Ratio: 1.7 (ref 1.2–2.2)
Albumin: 4.5 g/dL (ref 3.8–4.8)
Alkaline Phosphatase: 69 IU/L (ref 44–121)
BUN/Creatinine Ratio: 15 (ref 9–23)
BUN: 12 mg/dL (ref 6–24)
Bilirubin Total: <0.2 mg/dL (ref 0.0–1.2)
CO2: 23 mmol/L (ref 20–29)
Calcium: 9.4 mg/dL (ref 8.7–10.2)
Chloride: 103 mmol/L (ref 96–106)
Creatinine, Ser: 0.8 mg/dL (ref 0.57–1.00)
Globulin, Total: 2.7 g/dL (ref 1.5–4.5)
Glucose: 96 mg/dL (ref 70–99)
Potassium: 4.2 mmol/L (ref 3.5–5.2)
Sodium: 139 mmol/L (ref 134–144)
Total Protein: 7.2 g/dL (ref 6.0–8.5)
eGFR: 94 mL/min/{1.73_m2} (ref >59)

## 2021-07-10 LAB — B12 AND FOLATE PANEL
Folate: 12.1 ng/mL (ref >3.0)
Vitamin B-12: 440 pg/mL (ref 232–1245)

## 2021-07-10 LAB — SEDIMENTATION RATE: Sed Rate: 15 mm/hr (ref 0–32)

## 2021-07-10 LAB — ANA W/REFLEX IF POSITIVE: Anti Nuclear Antibody (ANA): NEGATIVE

## 2021-07-10 LAB — C-REACTIVE PROTEIN: CRP: <1 mg/L (ref 0–10)

## 2021-07-10 LAB — RHEUMATOID FACTOR: Rheumatoid fact SerPl-aCnc: <10 IU/mL (ref <14.0)

## 2021-07-10 MED ORDER — DULOXETINE HCL 20 MG PO CPEP: 20.0000 mg | ORAL_CAPSULE | Freq: Every day | ORAL | 1 refills | Status: AC

## 2021-07-12 NOTE — Progress Notes (Signed)
[facility]  PATIENT: Paula Jarvis DOB: 28-Aug-1975  REFERRING CLINICIAN: Lorrene Reid, PA-C HISTORY FROM: self and husband REASON FOR VISIT: leg numbness and weakness   HISTORICAL  CHIEF COMPLAINT:  Chief Complaint  Patient presents with   New Patient (Initial Visit)    Rm 1. Accompanied by husband.  NP internal referral for Numbness of lower extremity and dizziness.    HISTORY OF PRESENT ILLNESS:  The patient presents for evaluation of new onset leg numbness and weakness. States she woke up 2-3 weeks ago and could not move either of her legs. No recent trauma or infections. Legs were swollen and painful. After 2 hours her right leg regained it's strength, but left leg is still weak. Right leg is now starting to weaken again. She has not gone to work due to difficulty walking. She does have pain around her waist and lower back on the left which shoots down her left leg. States this feels similar to when she previously had sciatica. Has twinges pain in her right leg at various locations but no shooting pain. Has twinges of pain in her right wrist as well. She also developed burning in her feet 3 days ago. Denies saddle anesthesia or incontinence.   She presented to the ED 11/30 for her symptoms. States she waited for 10 hours and  left without being seen. Saw her PCP 07/02/21 who prescribed gabapentin and ordered a DVT US. There was no evidence of thrombosis on [place]. Blood work including ESR, CRP, B12, ANA, and RF was wnl.  OTHER MEDICAL CONDITIONS: none   REVIEW OF SYSTEMS: Full 14 system review of systems performed and negative with exception of: leg numbness, weakness, pain  ALLERGIES: No Known Allergies  HOME MEDICATIONS: Outpatient Medications Prior to Visit  Medication Sig Dispense Refill   DULoxetine (CYMBALTA) 20 MG capsule Take 1 capsule (20 mg total) by mouth daily. 30 capsule 1   methylPREDNISolone (MEDROL DOSEPAK) 4 MG TBPK tablet Take as  directed on pack 21 tablet 1   No facility-administered medications prior to visit.    PAST MEDICAL HISTORY: Past Medical History:  Diagnosis Date   Thrombocytosis     PAST SURGICAL HISTORY: Past Surgical History:  Procedure Laterality Date   TUBAL LIGATION      FAMILY HISTORY: History reviewed. No pertinent family history.  SOCIAL HISTORY: Social History   Socioeconomic History   Marital status: Married    Spouse name: Not on file   Number of children: Not on file   Years of education: Not on file   Highest education level: Not on file  Occupational History   Not on file  Tobacco Use   Smoking status: Never   Smokeless tobacco: Never  Substance and Sexual Activity   Alcohol use: No   Drug use: No   Sexual activity: Not on file  Other Topics Concern   Not on file  Social History Narrative   Not on file   Social Determinants of Health   Financial Resource Strain: Not on file  Food Insecurity: Not on file  Transportation Needs: Not on file  Physical Activity: Not on file  Stress: Not on file  Social Connections: Not on file  Intimate Partner Violence: Not on file     PHYSICAL EXAM  GENERAL EXAM/CONSTITUTIONAL: Vitals:  Vitals:   07/13/21 1028  BP: 127/83  Pulse: 64  Weight: 210 lb 1 oz (95.3 kg)  Height: _0  (1.753 m)   Body mass index is  31.02 kg/m. Wt Readings from Last 3 Encounters:  07/13/21 210 lb 1 oz (95.3 kg)  07/02/21 209 lb (94.8 kg)   Patient is in no distress; well developed, nourished and groomed; neck is supple  CARDIOVASCULAR: Examination of peripheral vascular system by observation and palpation is normal  EYES: Pupils round and reactive to light, Visual fields full to confrontation, Extraocular movements intact  MUSCULOSKELETAL: Gait, strength, tone, movements noted in Neurologic exam below  NEUROLOGIC: MENTAL STATUS:  awake, alert, oriented to person, place and time recent and remote memory intact normal  attention and concentration language fluent, comprehension intact, naming intact fund of knowledge appropriate  CRANIAL NERVE:  2nd, 3rd, 4th, 6th - pupils equal and reactive to light, visual fields full to confrontation, extraocular muscles intact, no nystagmus 5th - decreased sensation right V1-3 7th - facial strength symmetric 8th - hearing intact 9th - palate elevates symmetrically, uvula midline 11th - shoulder shrug symmetric 12th - tongue protrusion midline  MOTOR:  3/5 left hip flexion, 4/5 left knee flexion and extension, 4/5 dorsiflexion. 5/5 RLE and bilateral upper extremities.  SENSORY:  Decreased sensation to light touch and pinprick over entire left thigh, front left shin. Sensation intact to light touch and pinprick all other extremities  COORDINATION:  finger-nose-finger intact bilaterally  REFLEXES:  3+ left patellar, other reflexes 2+ throughout  GAIT/STATION:  Left knee buckles while walking, uses support from partner to walk   DIAGNOSTIC DATA (LABS, IMAGING, TESTING) - I reviewed patient records, labs, notes, testing and imaging myself where available.  Lab Results  Component Value Date   WBC 4.7 06/27/2021   HGB 9.3 (L) 06/27/2021   HCT 31.7 (L) 06/27/2021   MCV 84.8 06/27/2021   PLT 377 06/27/2021      Component Value Date/Time   NA 139 07/09/2021 0831   K 4.2 07/09/2021 0831   CL 103 07/09/2021 0831   CO2 23 07/09/2021 0831   GLUCOSE 96 07/09/2021 0831   GLUCOSE 95 06/27/2021 2217   BUN 12 07/09/2021 0831   CREATININE 0.80 07/09/2021 0831   CALCIUM 9.4 07/09/2021 0831   PROT 7.2 07/09/2021 0831   ALBUMIN 4.5 07/09/2021 0831   AST 11 07/09/2021 0831   ALT 10 07/09/2021 0831   ALKPHOS 69 07/09/2021 0831   BILITOT <0.2 07/09/2021 0831   GFRNONAA >60 06/27/2021 2217   GFRAA >60 03/23/2020 1858   No results found for: CHOL, HDL, LDLCALC, LDLDIRECT, TRIG, CHOLHDL No results found for: HGBA1C Lab Results  Component Value Date    VITAMINB12 440 07/09/2021   No results found for: TSH   ASSESSMENT AND PLAN  45 y.o. year old female who presents for evaluation of 2-3 weeks of L>R leg pain and weakness.  She is having difficulty ambulating due to leg weakness. Discussed going to the ED for evaluation, states she already went to the ED previously and waited for 10 hours without being seen. Will order MRI L-spine and T-spine to assess for inflammatory/structural causes of bilateral leg weakness and numbness. If MRI is unrevealing may consider EMG. Advised her that if she feels unsafe at home due to inability to ambulate, or if symptoms worsen she should present to the ED for expedited workup.  1. Weakness of left lower extremity   2. Neuropathy     PLAN: -MRI thoracic and lumbar spine -A1c, TSH -Advised to go to ED for expedited workup if symptoms worsen or unable to ambulate at home -next steps: consider EMG if MRI unrevealing  Orders Placed This Encounter  Procedures   MR LUMBAR SPINE W CONTRAST   MR THORACIC SPINE W WO CONTRAST   Hemoglobin A1c   TSH    No orders of the defined types were placed in this encounter.   Return in about 3 months (around 10/11/2021).    Genia Harold, MD 07/13/21 12:34 PM  I spent an average of 41 minutes chart reviewing and counseling the patient, with at least 50% of the time face to face with the patient.   [facility] [address] [place], [place] [address] [phone]

## 2021-07-13 ENCOUNTER — Ambulatory Visit (INDEPENDENT_AMBULATORY_CARE_PROVIDER_SITE_OTHER): Payer: Commercial Managed Care - PPO | Admitting: Psychiatry

## 2021-07-13 ENCOUNTER — Encounter: Payer: Self-pay | Admitting: Psychiatry

## 2021-07-13 VITALS — BP 127/83 | HR 64 | Ht 69.0 in | Wt 210.1 lb

## 2021-07-13 DIAGNOSIS — G629 Polyneuropathy, unspecified: Secondary | ICD-10-CM | POA: Diagnosis not present

## 2021-07-13 DIAGNOSIS — R29898 Other symptoms and signs involving the musculoskeletal system: Secondary | ICD-10-CM | POA: Diagnosis not present

## 2021-07-13 NOTE — Patient Instructions (Signed)
Blood work - thyroid and blood sugar MRI of lower back Follow up after testing

## 2021-07-14 LAB — HEMOGLOBIN A1C
Est. average glucose Bld gHb Est-mCnc: 117 mg/dL
Hgb A1c MFr Bld: 5.7 % — ABNORMAL HIGH (ref 4.8–5.6)

## 2021-07-14 LAB — TSH: TSH: 0.357 u[IU]/mL — ABNORMAL LOW (ref 0.450–4.500)

## 2021-07-16 ENCOUNTER — Telehealth: Payer: Self-pay | Admitting: Physician Assistant

## 2021-07-16 NOTE — Telephone Encounter (Signed)
Patient called to let you know not only is her legs hurting still but now her back is hurting. Please advise. [phone]

## 2021-07-16 NOTE — Telephone Encounter (Signed)
Spoke with patient about back pain. She is experiencing it on her left side. Has to lie on right side for relief. Is taking the Cymbalta but it does not take off all the edge. Maritza recommended the patient take the Cymbalta twice a day to help with pain. Patient has a follow up scheduled on Thursday to address issue.

## 2021-07-17 ENCOUNTER — Telehealth: Payer: Self-pay | Admitting: Psychiatry

## 2021-07-17 DIAGNOSIS — R29898 Other symptoms and signs involving the musculoskeletal system: Secondary | ICD-10-CM

## 2021-07-17 NOTE — Telephone Encounter (Signed)
The MRI Lumbar spine would have to be changed to either without contrast or with/without contrast. It can't be done with just with contrast.

## 2021-07-17 NOTE — Telephone Encounter (Signed)
Updated order placed, thanks

## 2021-07-17 NOTE — Telephone Encounter (Signed)
Noted, thank you [facility] is pending faxed notes and marked it urgent.

## 2021-07-17 NOTE — Telephone Encounter (Signed)
I called [facility] to check the status it is still pending they received the fax notes.

## 2021-07-18 ENCOUNTER — Encounter (HOSPITAL_COMMUNITY): Payer: Self-pay

## 2021-07-18 ENCOUNTER — Observation Stay (HOSPITAL_COMMUNITY)
Admission: EM | Admit: 2021-07-18 | Discharge: 2021-07-20 | Disposition: A | Discharge status: Home / Self Care | Payer: Commercial Managed Care - PPO | Attending: Emergency Medicine | Admitting: Emergency Medicine

## 2021-07-18 ENCOUNTER — Other Ambulatory Visit: Payer: Self-pay

## 2021-07-18 ENCOUNTER — Emergency Department (HOSPITAL_COMMUNITY): Payer: Commercial Managed Care - PPO

## 2021-07-18 DIAGNOSIS — M79605 Pain in left leg: Secondary | ICD-10-CM | POA: Insufficient documentation

## 2021-07-18 DIAGNOSIS — R531 Weakness: Principal | ICD-10-CM | POA: Insufficient documentation

## 2021-07-18 DIAGNOSIS — M7062 Trochanteric bursitis, left hip: Secondary | ICD-10-CM | POA: Insufficient documentation

## 2021-07-18 DIAGNOSIS — Y939 Activity, unspecified: Secondary | ICD-10-CM | POA: Insufficient documentation

## 2021-07-18 DIAGNOSIS — U071 COVID-19: Secondary | ICD-10-CM | POA: Insufficient documentation

## 2021-07-18 DIAGNOSIS — M79604 Pain in right leg: Secondary | ICD-10-CM | POA: Diagnosis not present

## 2021-07-18 DIAGNOSIS — M545 Low back pain, unspecified: Secondary | ICD-10-CM | POA: Insufficient documentation

## 2021-07-18 DIAGNOSIS — M79601 Pain in right arm: Secondary | ICD-10-CM

## 2021-07-18 DIAGNOSIS — R29898 Other symptoms and signs involving the musculoskeletal system: Secondary | ICD-10-CM | POA: Diagnosis present

## 2021-07-18 DIAGNOSIS — Z6831 Body mass index (BMI) 31.0-31.9, adult: Secondary | ICD-10-CM

## 2021-07-18 LAB — CK: Total CK: 59 U/L (ref 38–234)

## 2021-07-18 LAB — CBC WITH DIFFERENTIAL/PLATELET
Abs Immature Granulocytes: 0 10*3/uL (ref 0.00–0.07)
Basophils Absolute: 0 10*3/uL (ref 0.0–0.1)
Basophils Relative: 0 %
Eosinophils Absolute: 0 10*3/uL (ref 0.0–0.5)
Eosinophils Relative: 1 %
HCT: 36 % (ref 36.0–46.0)
Hemoglobin: 10.5 g/dL — ABNORMAL LOW (ref 12.0–15.0)
Immature Granulocytes: 0 %
Lymphocytes Relative: 38 %
Lymphs Abs: 1.4 10*3/uL (ref 0.7–4.0)
MCH: 25.8 pg — ABNORMAL LOW (ref 26.0–34.0)
MCHC: 29.2 g/dL — ABNORMAL LOW (ref 30.0–36.0)
MCV: 88.5 fL (ref 80.0–100.0)
Monocytes Absolute: 0.6 10*3/uL (ref 0.1–1.0)
Monocytes Relative: 17 %
Neutro Abs: 1.6 10*3/uL — ABNORMAL LOW (ref 1.7–7.7)
Neutrophils Relative %: 44 %
Platelets: 356 10*3/uL (ref 150–400)
RBC: 4.07 MIL/uL (ref 3.87–5.11)
RDW: 17.6 % — ABNORMAL HIGH (ref 11.5–15.5)
WBC: 3.6 10*3/uL — ABNORMAL LOW (ref 4.0–10.5)
nRBC: 0 % (ref 0.0–0.2)

## 2021-07-18 LAB — BASIC METABOLIC PANEL
Anion gap: 5 (ref 5–15)
BUN: 9 mg/dL (ref 6–20)
CO2: 26 mmol/L (ref 22–32)
Calcium: 8.6 mg/dL — ABNORMAL LOW (ref 8.9–10.3)
Chloride: 107 mmol/L (ref 98–111)
Creatinine, Ser: 0.73 mg/dL (ref 0.44–1.00)
GFR, Estimated: >60 mL/min (ref >60)
Glucose, Bld: 95 mg/dL (ref 70–99)
Potassium: 4.1 mmol/L (ref 3.5–5.1)
Sodium: 138 mmol/L (ref 135–145)

## 2021-07-18 LAB — I-STAT BETA HCG BLOOD, ED (MC, WL, AP ONLY): I-stat hCG, quantitative: <5 m[IU]/mL (ref <5)

## 2021-07-18 LAB — RESP PANEL BY RT-PCR (FLU A&B, COVID) ARPGX2
Influenza A by PCR: NEGATIVE
Influenza B by PCR: NEGATIVE
SARS Coronavirus 2 by RT PCR: POSITIVE — AB

## 2021-07-18 IMAGING — MR MR THORACIC SPINE WO/W CM
8 of 9 series · 33 of 48 positions shown · IV contrast (gadavist)
Comparison: None available.

CLINICAL DATA: Initial evaluation for myelopathy, lower extremity
weakness over past 3 weeks.

EXAM:
MRI THORACIC WITHOUT AND WITH CONTRAST
TECHNIQUE: Multiplanar and multiecho pulse sequences of the thoracic spine were
obtained without and with intravenous contrast.
CONTRAST:  10mL GADAVIST GADOBUTROL 1 MMOL/ML IV SOLN

[Series 17: T1 · sagittal · 4.0mm · 1.72mm/px · 2 of 5 slices shown (1 of 3)]
[im 1/5]
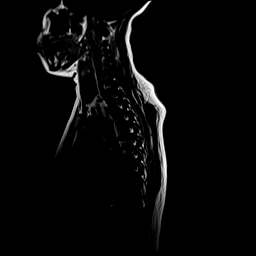
[im 5/5]
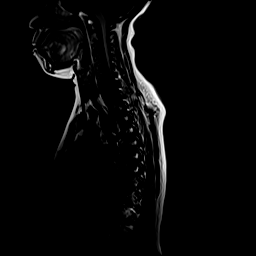

[Series 18: STIR · sagittal · 3.0mm · 1.00mm/px · 5 of 17 slices shown]
[im 1/17]
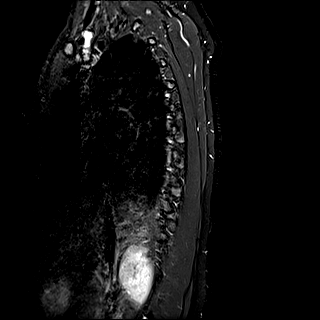
[im 5/17]
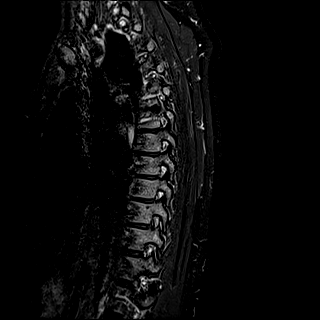
[im 9/17]
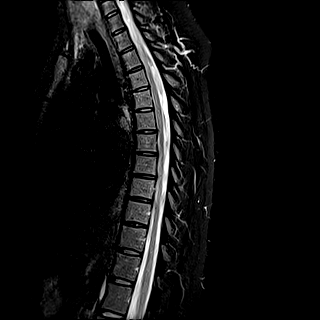
[im 13/17]
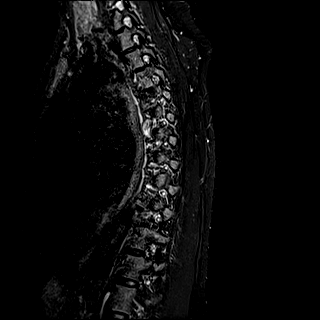
[im 17/17]
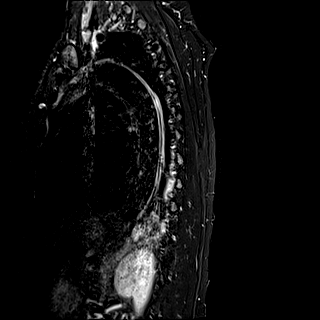

[Series 19: T1 · sagittal · 3.0mm · 1.00mm/px · 3 of 15 slices shown (2 of 3)]
[im 1/15]
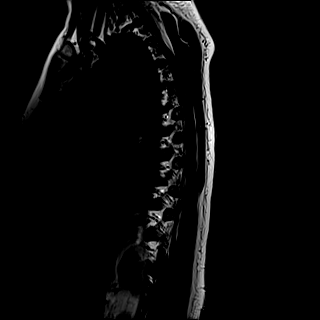
[im 8/15]
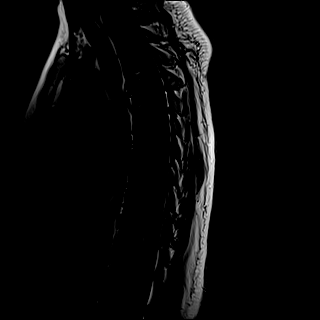
[im 15/15]
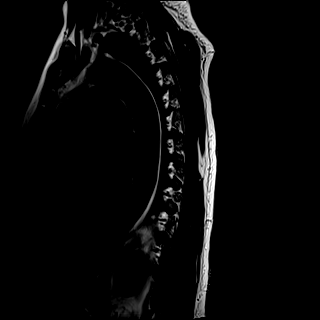

[Series 20: T2 · axial · 4.0mm · 0.78mm/px · z∈[-468,-247]mm · 8 of 39 slices shown]
[im 1/39]
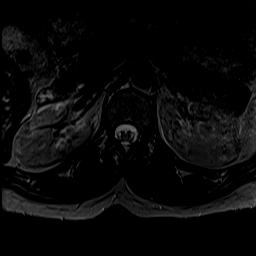
[im 6/39]
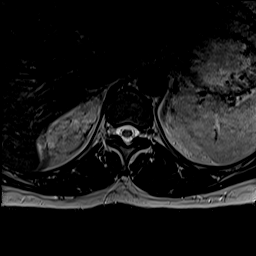
[im 11/39]
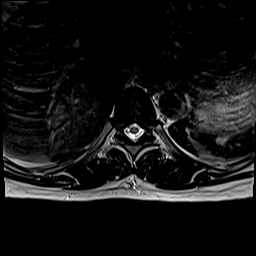
[im 17/39]
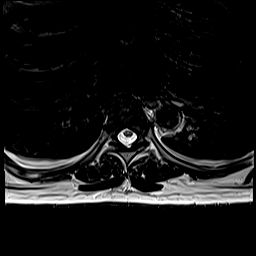
[im 22/39]
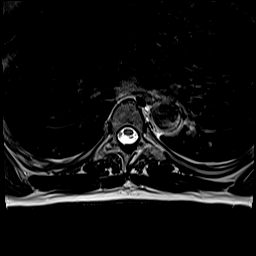
[im 28/39]
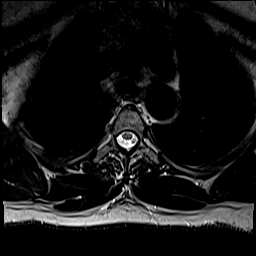
[im 33/39]
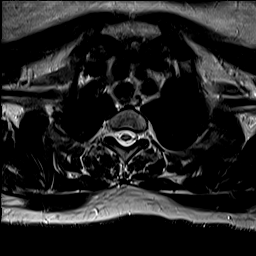
[im 39/39]
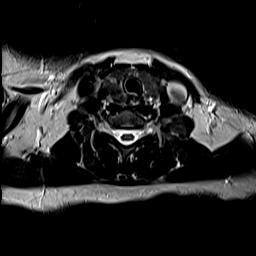

[Series 22: T1 · axial · 4.0mm · 0.39mm/px · z∈[-468,-247]mm · 8 of 39 slices shown (3 of 3)]
[im 1/39]
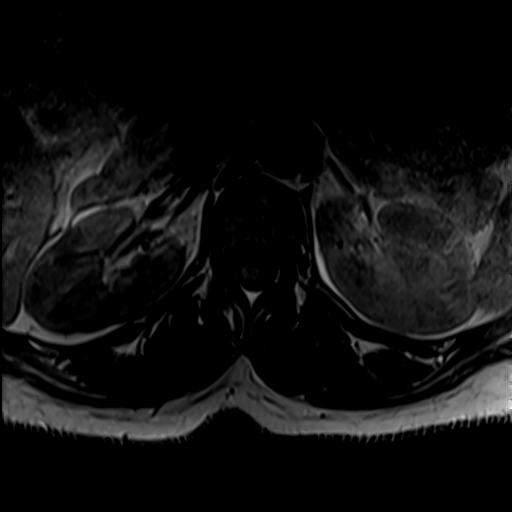
[im 6/39]
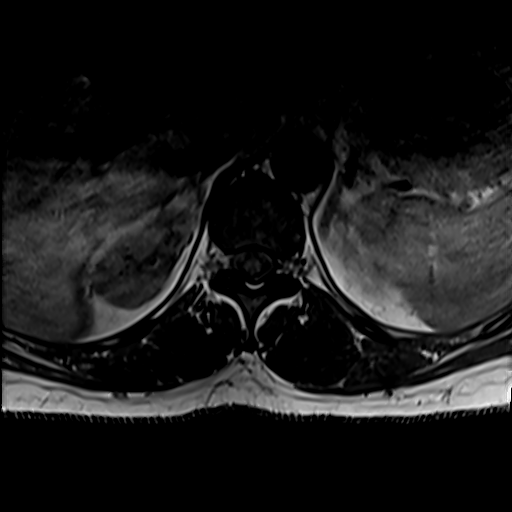
[im 11/39]
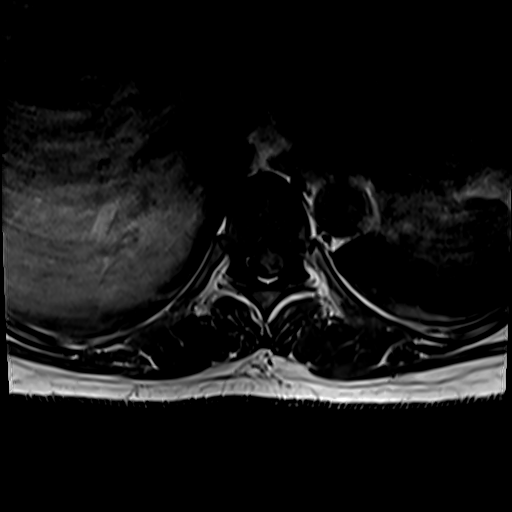
[im 17/39]
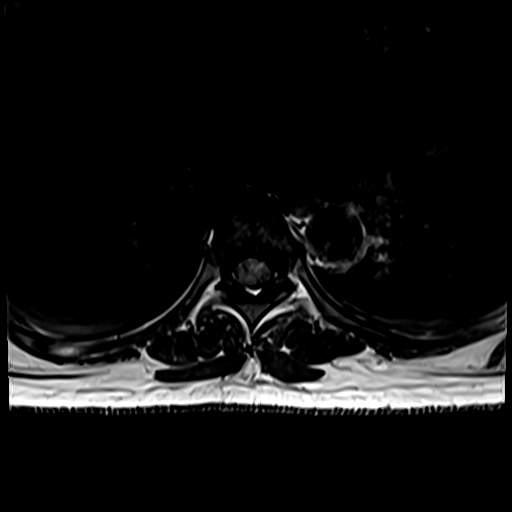
[im 22/39]
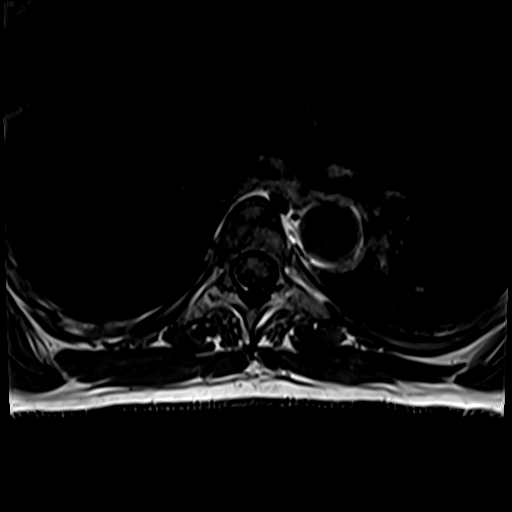
[im 28/39]
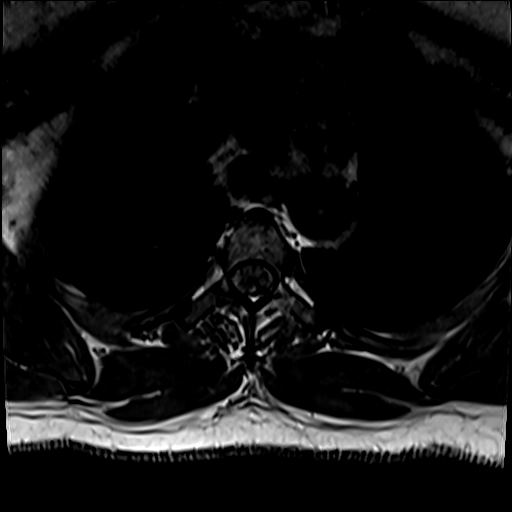
[im 33/39]
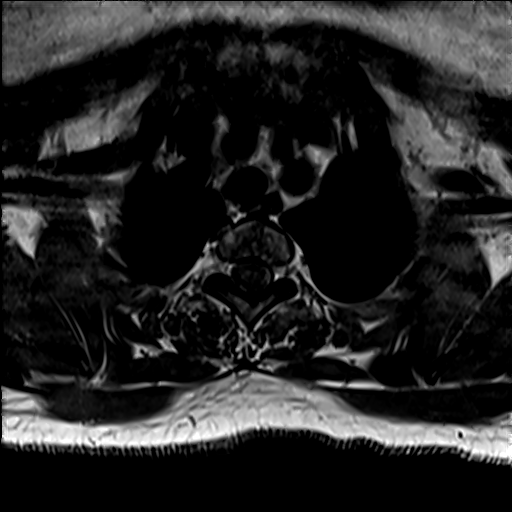
[im 39/39]
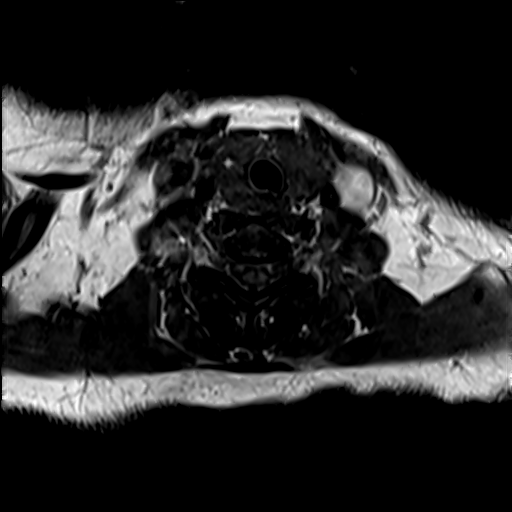

[Series 23: T2 post-contrast · sagittal · 3.0mm · 0.83mm/px · 3 of 15 slices shown]
[im 1/15]
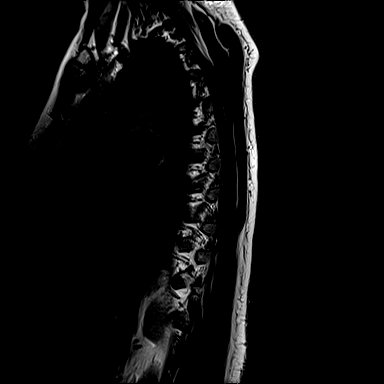
[im 8/15]
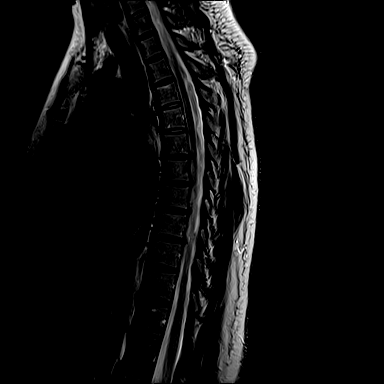
[im 15/15]
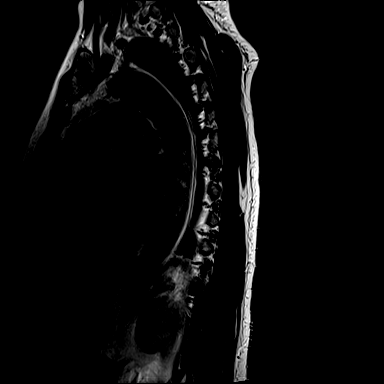

[Series 24: T1 fat-sat post-contrast · sagittal · 3.0mm · 1.25mm/px · 3 of 15 slices shown]
[im 1/15]
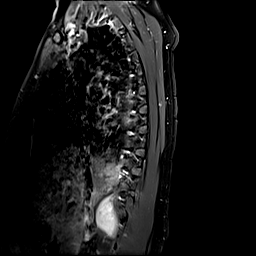
[im 8/15]
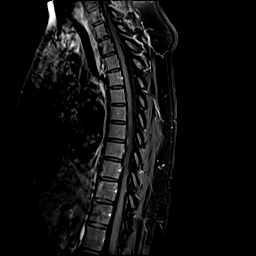
[im 15/15]
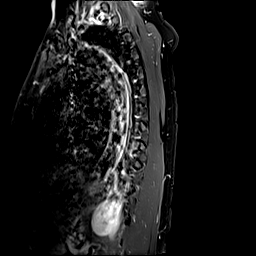

[Series 25: T1 post-contrast · axial · 4.0mm · 0.39mm/px · 1 of 39 slices shown]
[im 1/39]
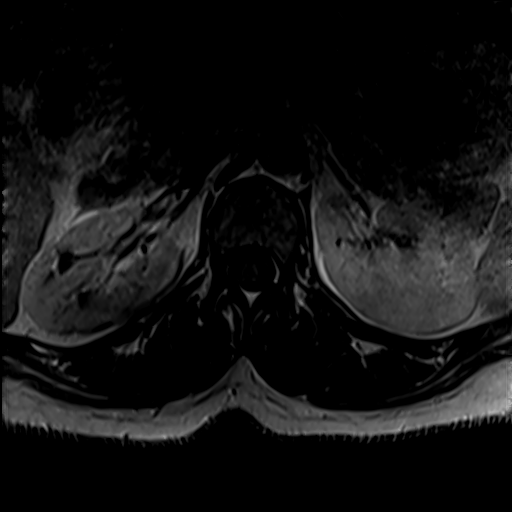

[33 of 48 positions shown; findings below may reference images not displayed]

FINDINGS: Alignment: Trace dextroscoliosis. Alignment otherwise normal with
preservation of the normal thoracic kyphosis. No listhesis.

Vertebrae: Vertebral body height maintained without acute or chronic
fracture. Bone marrow signal intensity diffusely decreased on T1
weighted sequence, nonspecific, but most commonly related to anemia,
smoking or obesity. No worrisome osseous lesions. No abnormal marrow
edema or enhancement.

Cord:  Normal signal morphology.  No abnormal enhancement.

Paraspinal and other soft tissues: Paraspinous soft tissues
demonstrate no acute finding. Trace layering bilateral pleural
effusions noted. 7 mm simple cyst present at the hepatic dome.
Probable cholelithiasis noted (series 20, image 39).

Disc levels:

T1-2: Tiny central/right paracentral disc protrusion minimally
indents the ventral thecal sac. No significant spinal stenosis or
cord deformity. Foramina remain patent.

T2-3: Unremarkable.

T3-4: Unremarkable.

T4-5: Unremarkable.

T5-6: Unremarkable.

T6-7: Shallow left paracentral disc protrusion mildly flattens the
left ventral thecal sac. No significant spinal stenosis or cord
deformity. Foramina remain patent.

T7-8: Shallow left paracentral disc protrusion mildly flattens the
left ventral thecal sac. No significant spinal stenosis or cord
deformity. Foramina remain patent.

T8-9: Shallow left paracentral disc protrusion mildly indents the
ventral thecal sac. No significant spinal stenosis or cord
deformity. Foramina remain patent.

T9-10: Small left paracentral to foraminal disc protrusion mildly
flattens the left ventral thecal sac. No significant spinal stenosis
or cord deformity. Foramina remain patent.

T10-11: Normal interspace.  Mild facet hypertrophy.  No stenosis.

T11-12: Normal interspace. Mild left-sided facet hypertrophy. No
stenosis.

T12-L1: Unremarkable.
IMPRESSION: 1. Normal MRI appearance of the thoracic spinal cord. No evidence
for myelopathy or other abnormality.
2. Mild for age degenerative spondylosis with small disc protrusions
at T1-2 and T6-7 through T9-10 as above. No significant stenosis or
neural impingement.
3. Trace layering bilateral pleural effusions.
4. Probable cholelithiasis.

## 2021-07-18 IMAGING — MR MR HEAD WO/W CM
13 series · 48 of 48 positions shown · IV contrast (10 GADAVIST)
Comparison: None.

CLINICAL DATA: Initial evaluation for myelopathy, lower extremity
weakness over past 3 weeks, new right upper extremity arm pain.

EXAM:
MRI HEAD WITHOUT AND WITH CONTRAST
TECHNIQUE: Multiplanar, multiecho pulse sequences of the brain and surrounding
structures were obtained without and with intravenous contrast.
CONTRAST:  10mL GADAVIST GADOBUTROL 1 MMOL/ML IV SOLN

[Series 6: DWI · axial · 3.0mm · 1.36mm/px · z∈[-132,+35]mm · 7 of 115 slices shown (1 of 2)]
[im 1/115]
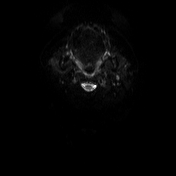
[im 20/115]
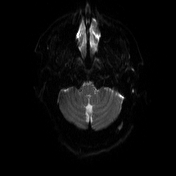
[im 39/115]
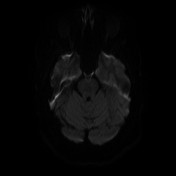
[im 58/115]
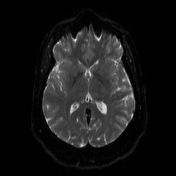
[im 77/115]
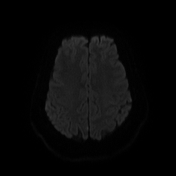
[im 96/115]
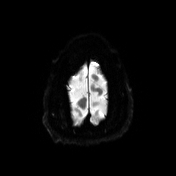
[im 115/115]
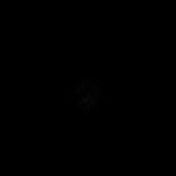

[Series 7: DWI · axial · 3.0mm · 1.36mm/px · z∈[-132,+35]mm · 3 of 58 slices shown (2 of 2)]
[im 1/58]
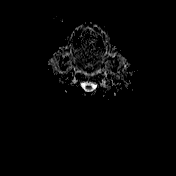
[im 29/58]
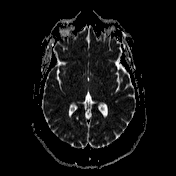
[im 58/58]
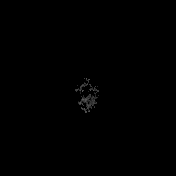

[Series 8: T1 · sagittal · 5.0mm · 0.75mm/px · 2 of 26 slices shown (1 of 2)]
[im 1/26]
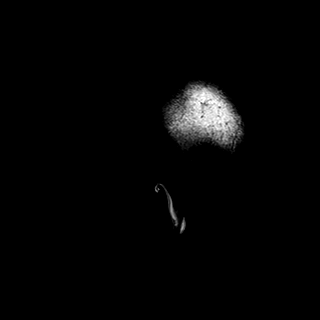
[im 26/26]
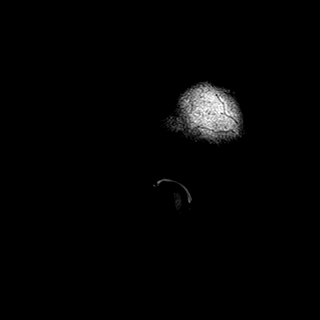

[Series 9: T2 · axial · 5.0mm · 0.62mm/px · z∈[-127,+33]mm · 2 of 26 slices shown]
[im 1/26]
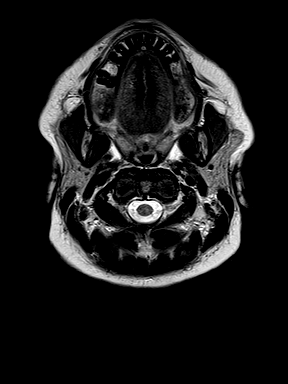
[im 26/26]
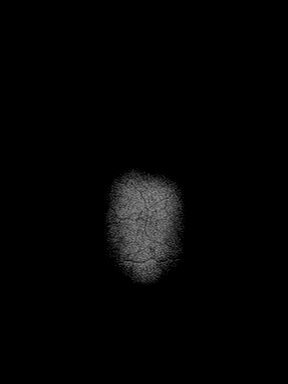

[Series 10: swi_images · axial · 3.0mm · 0.75mm/px · z∈[-128,+34]mm · 3 of 56 slices shown]
[im 1/56]
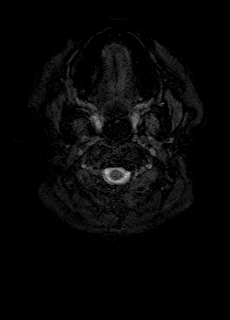
[im 28/56]
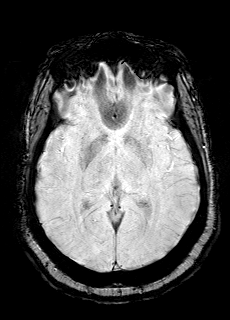
[im 56/56]
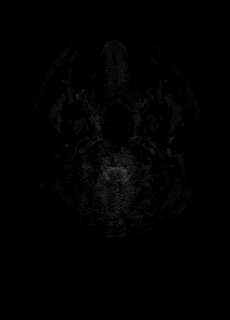

[Series 12: FLAIR · axial · 3.0mm · 0.75mm/px · z∈[-126,+33]mm · 3 of 55 slices shown]
[im 1/55]
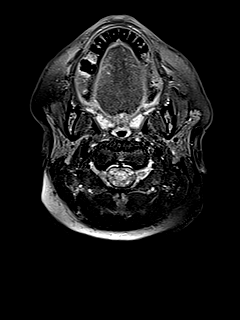
[im 28/55]
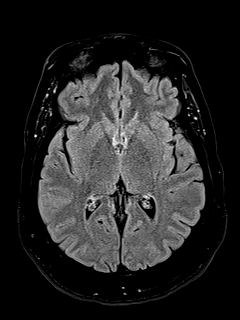
[im 55/55]
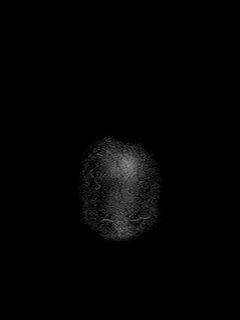

[Series 13: T1 · axial · 1.0mm · 0.94mm/px · z∈[-126,+30]mm · 9 of 160 slices shown (2 of 2)]
[im 1/160]
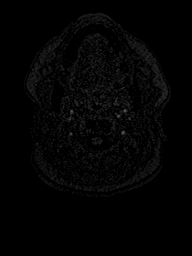
[im 20/160]
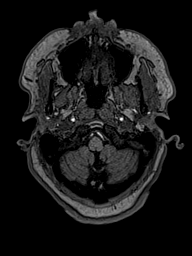
[im 40/160]
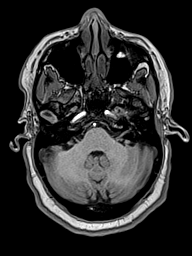
[im 60/160]
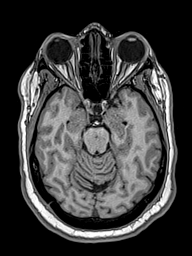
[im 80/160]
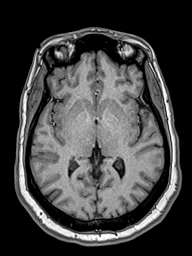
[im 100/160]
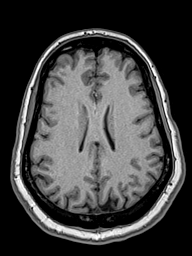
[im 120/160]
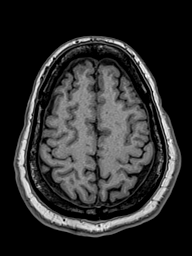
[im 140/160]
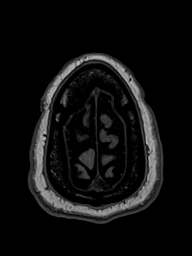
[im 160/160]
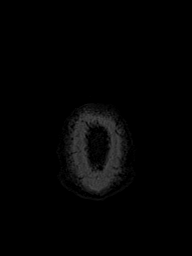

[Series 14: cor dwi_tracew · coronal · 5.0mm · 1.53mm/px · 3 of 56 slices shown]
[im 1/56]
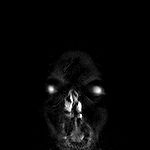
[im 28/56]
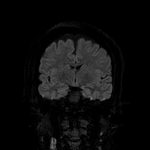
[im 56/56]
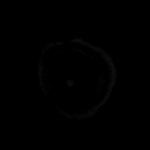

[Series 15: cor dwi_adc · coronal · 5.0mm · 1.53mm/px · 2 of 28 slices shown]
[im 1/28]
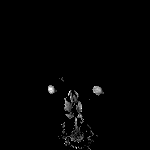
[im 28/28]
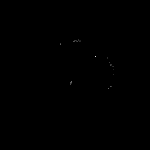

[Series 16: T2 post-contrast · coronal · 5.0mm · 0.57mm/px · 2 of 32 slices shown]
[im 1/32]
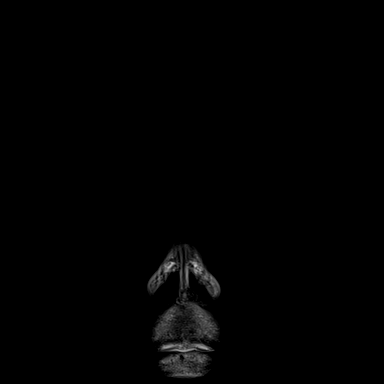
[im 32/32]
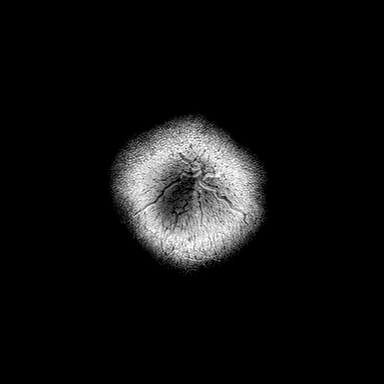

[Series 17: T1 post-contrast · axial · 1.0mm · 0.94mm/px · z∈[-118,+23]mm · 9 of 144 slices shown (1 of 3)]
[im 1/144]
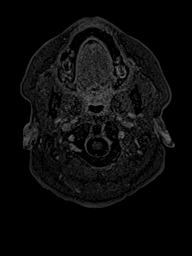
[im 18/144]
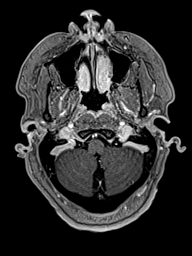
[im 36/144]
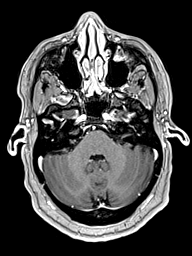
[im 54/144]
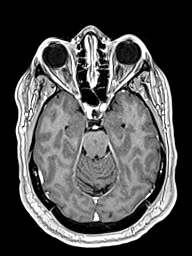
[im 72/144]
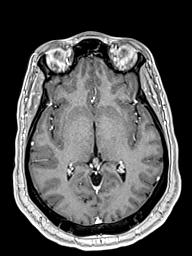
[im 90/144]
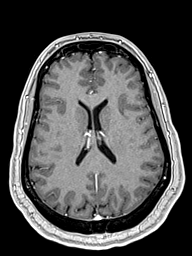
[im 108/144]
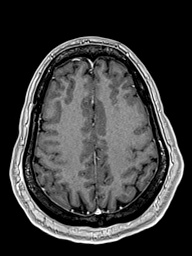
[im 126/144]
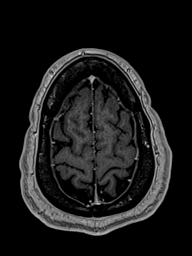
[im 144/144]
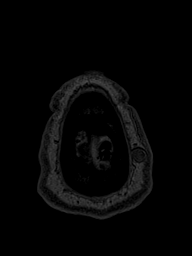

[Series 18: T1 post-contrast · coronal · 5.0mm · 0.43mm/px · 2 of 32 slices shown (2 of 3)]
[im 1/32]
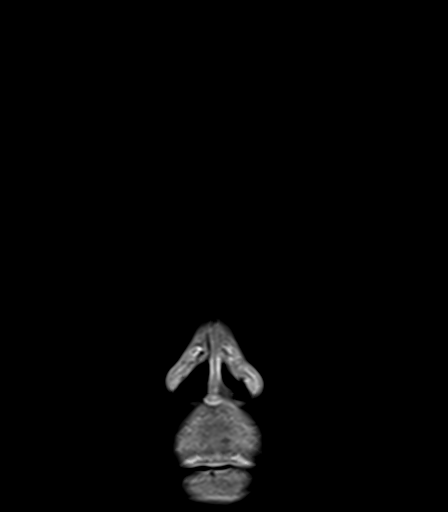
[im 32/32]
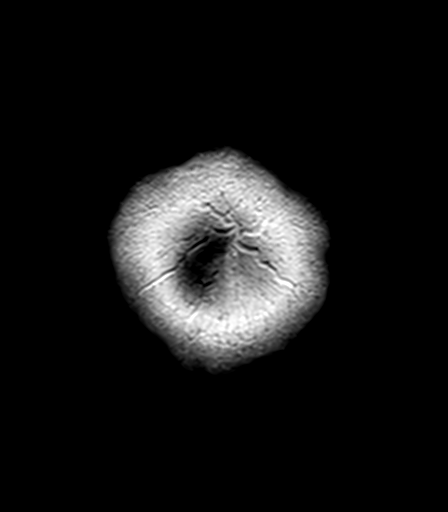

[Series 19: T1 post-contrast · sagittal · 5.0mm · 0.75mm/px · 1 of 25 slices shown (3 of 3)]
[im 1/25]
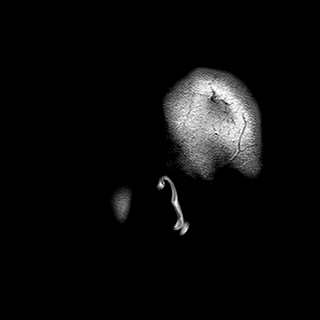

[48 of 48 positions shown; findings below may reference images not displayed]

FINDINGS: Brain: Cerebral volume within normal limits for patient age. Single
punctate focus of FLAIR hyperintensity noted within the
periventricular white matter of the anterior left frontal lobe
(series 12, image 29), nonspecific, but of doubtful significance in
isolation. No other focal parenchymal signal abnormality.

No abnormal foci of restricted diffusion to suggest acute or
subacute ischemia. Gray-white matter differentiation well
maintained. No encephalomalacia to suggest chronic infarction. No
foci of susceptibility artifact to suggest acute or chronic
intracranial hemorrhage.

No mass lesion, midline shift or mass effect. No hydrocephalus. No
extra-axial fluid collection. Major dural sinuses are grossly
patent.

Pituitary gland and suprasellar region are normal. Midline
structures intact and normal.

No abnormal enhancement.

Vascular: Major intracranial vascular flow voids well maintained and
normal in appearance.

Skull and upper cervical spine: Craniocervical junction normal.
Visualized upper cervical spine within normal limits. Bone marrow
signal intensity diffusely decreased on T1 weighted sequence,
nonspecific, but most commonly related to anemia, smoking or
obesity. 1.4 cm T2 hypointense nodular lesion at the left parietal
scalp (series 9, image 24), nonspecific, but likely benign. Scalp
soft tissues otherwise unremarkable.

Sinuses/Orbits: Globes and orbital soft tissues within normal
limits.

Mild scattered mucosal thickening noted within the ethmoidal air
cells and maxillary sinuses. Paranasal sinuses are otherwise clear.
No mastoid effusion. Inner ear structures grossly normal.

Other: None.
IMPRESSION: Normal brain MRI. No acute intracranial abnormality or findings to
explain patient's symptoms.

## 2021-07-18 IMAGING — MR MR CERVICAL SPINE WO/W CM
7 of 8 series · 33 of 48 positions shown · IV contrast (10 GADAVIST)
Comparison: None available.

CLINICAL DATA: Initial evaluation for chronic neck pain,
myelopathy, lower extremity weakness over past 3 weeks.

EXAM:
MRI CERVICAL SPINE WITHOUT AND WITH CONTRAST
TECHNIQUE: Multiplanar and multiecho pulse sequences of the cervical spine, to
include the craniocervical junction and cervicothoracic junction,
were obtained without and with intravenous contrast.
CONTRAST:  10mL GADAVIST GADOBUTROL 1 MMOL/ML IV SOLN

[Series 6: T1 · sagittal · 3.0mm · 0.69mm/px · 4 of 15 slices shown (1 of 2)]
[im 1/15]
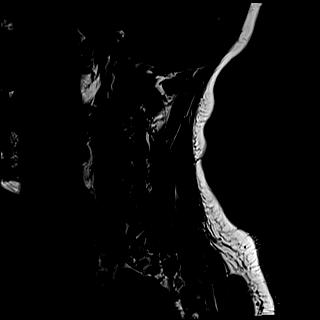
[im 5/15]
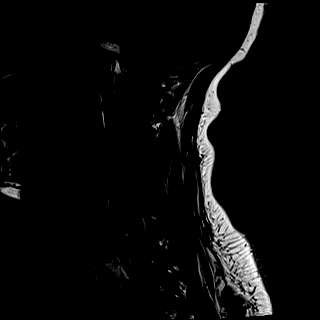
[im 10/15]
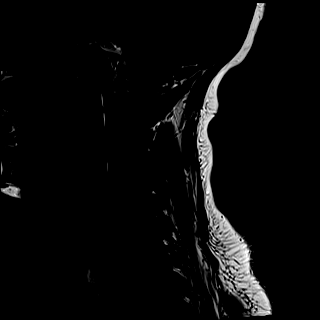
[im 15/15]
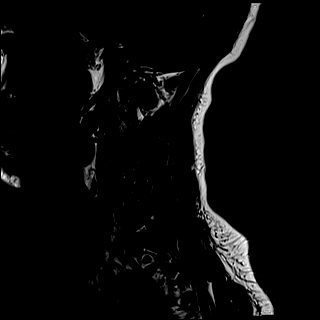

[Series 7: STIR · sagittal · 3.0mm · 0.86mm/px · 4 of 15 slices shown]
[im 1/15]
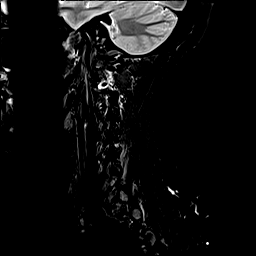
[im 5/15]
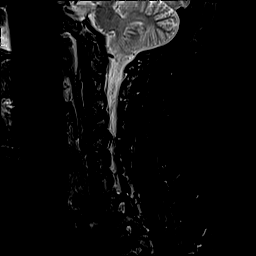
[im 10/15]
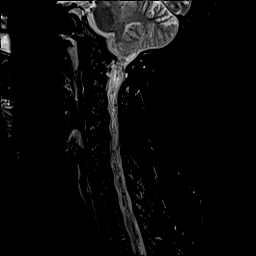
[im 15/15]
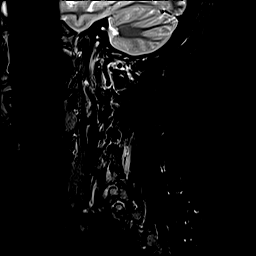

[Series 8: T2 · axial · 3.0mm · 0.70mm/px · z∈[-235,-134]mm · 8 of 29 slices shown]
[im 1/29]
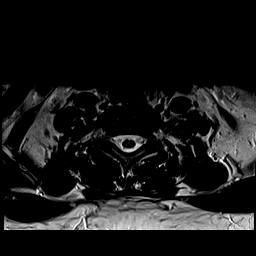
[im 5/29]
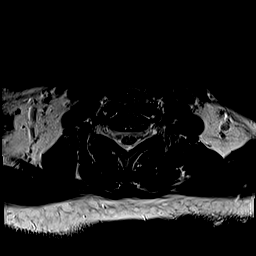
[im 9/29]
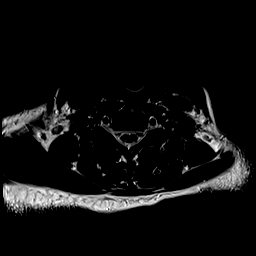
[im 13/29]
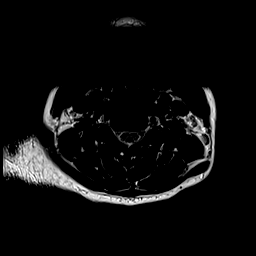
[im 17/29]
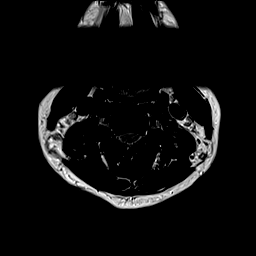
[im 21/29]
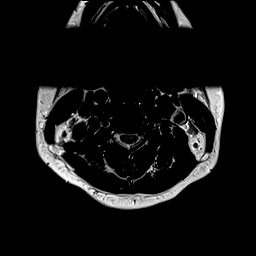
[im 25/29]
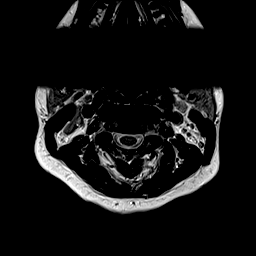
[im 29/29]
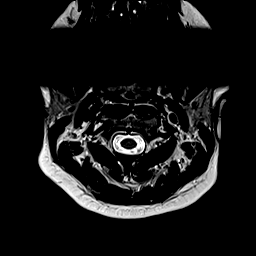

[Series 10: T1 · axial · 3.0mm · 0.35mm/px · z∈[-235,-134]mm · 8 of 30 slices shown (2 of 2)]
[im 1/30]
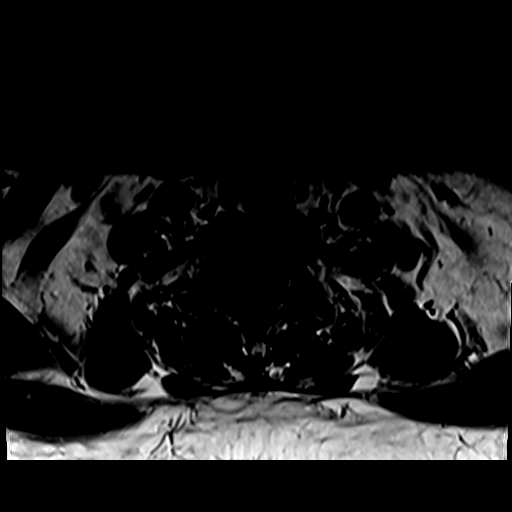
[im 5/30]
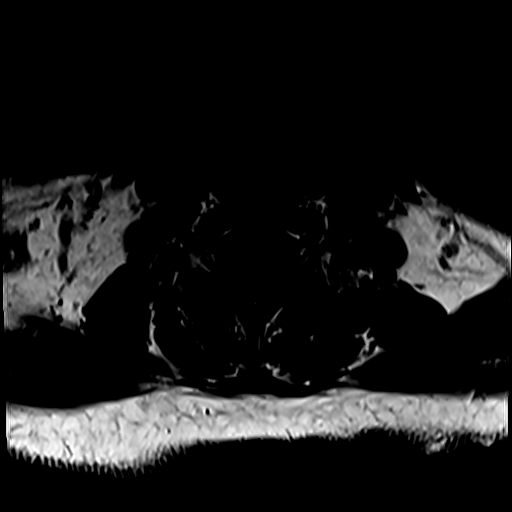
[im 9/30]
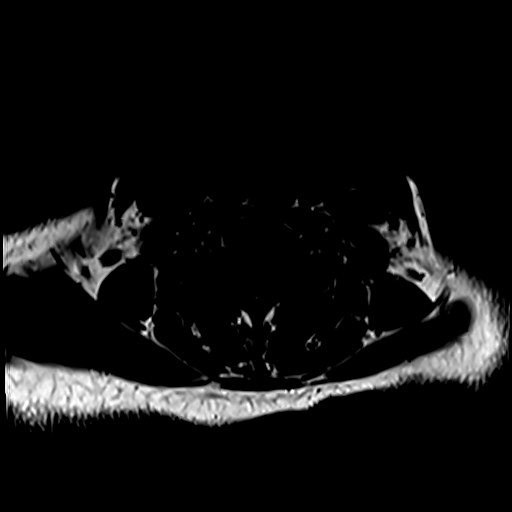
[im 13/30]
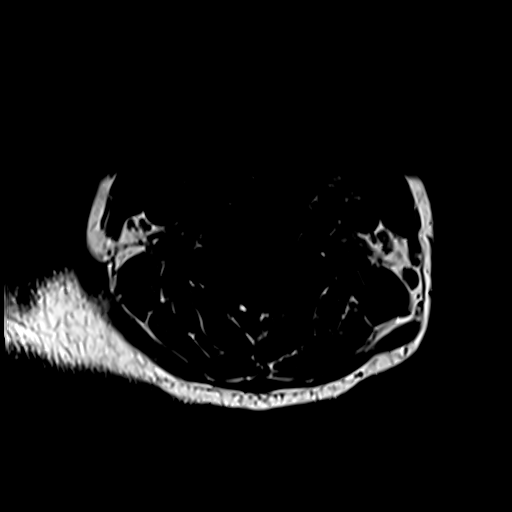
[im 17/30]
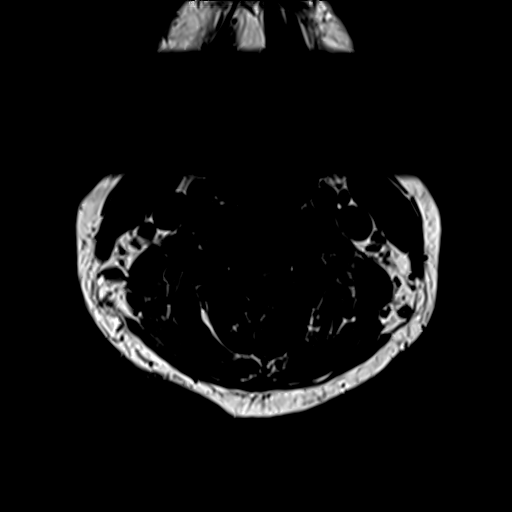
[im 21/30]
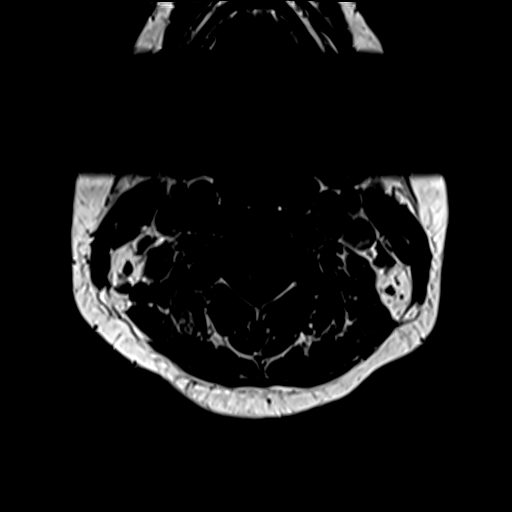
[im 25/30]
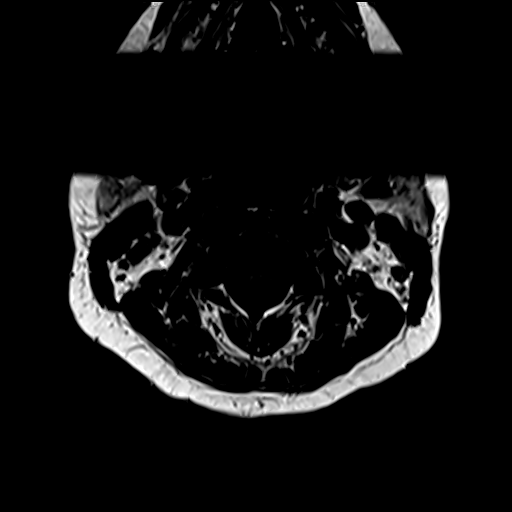
[im 30/30]
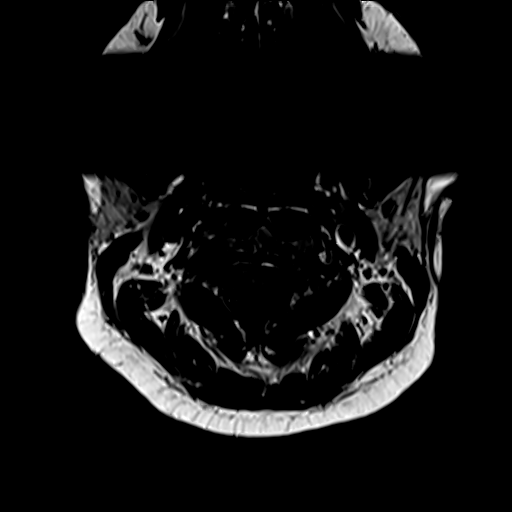

[Series 11: T2 post-contrast · sagittal · 3.0mm · 0.69mm/px · 4 of 15 slices shown]
[im 1/15]
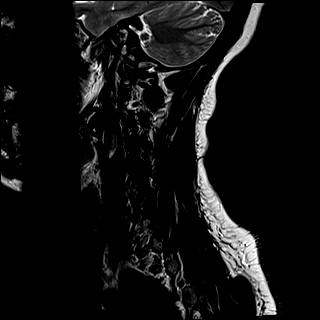
[im 5/15]
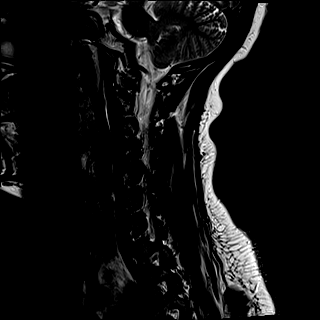
[im 10/15]
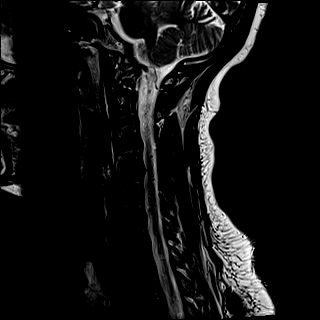
[im 15/15]
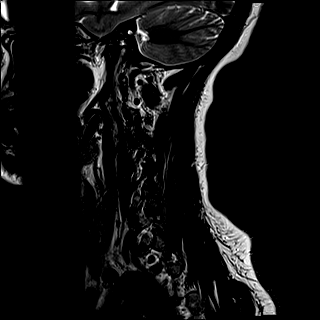

[Series 12: T1 fat-sat post-contrast · sagittal · 3.0mm · 0.69mm/px · 4 of 15 slices shown]
[im 1/15]
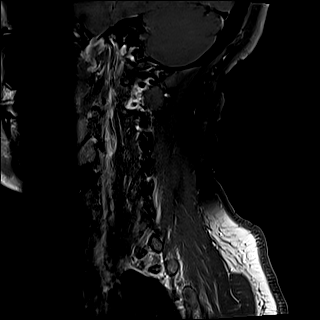
[im 5/15]
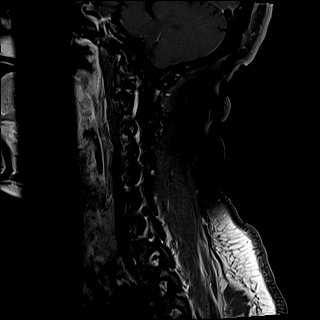
[im 10/15]
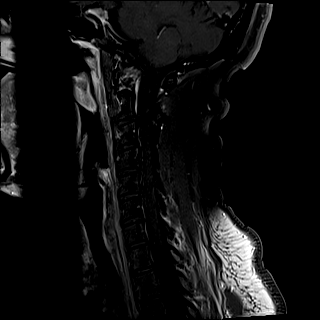
[im 15/15]
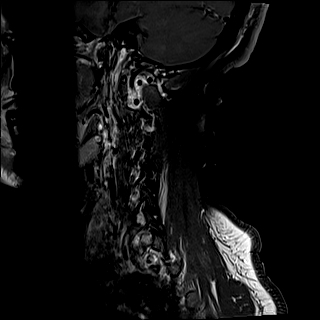

[Series 13: T1 post-contrast · axial · 3.0mm · 0.35mm/px · 1 of 29 slices shown]
[im 1/29]
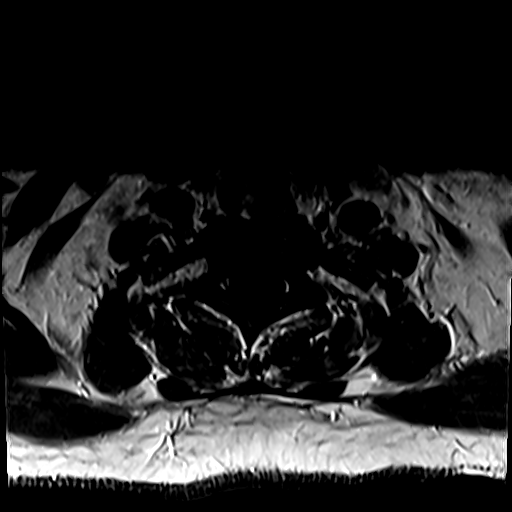

[33 of 48 positions shown; findings below may reference images not displayed]

FINDINGS: Alignment: Straightening of the normal cervical lordosis. No
listhesis.

Vertebrae: Vertebral body height maintained without acute or chronic
fracture. Bone marrow signal intensity diffusely decreased on T1
weighted sequence, nonspecific, but most commonly related to anemia,
smoking or obesity. No discrete or worrisome osseous lesions. No
abnormal marrow edema or enhancement.

Cord: Normal signal morphology.  No abnormal enhancement.

Posterior Fossa, vertebral arteries, paraspinal tissues: Visualized
brain and posterior fossa within normal limits. Craniocervical
junction normal. Paraspinous and prevertebral soft tissues within
normal limits. Normal intravascular flow voids seen within the
vertebral arteries bilaterally.

Disc levels:

C2-C3: Mild endplate spurring without significant disc bulge. No
canal or foraminal stenosis.

C3-C4: Mild disc bulge with endplate and uncovertebral spurring.
Flattening of the ventral thecal sac without significant spinal
stenosis or cord deformity. Foramina remain patent.

C4-C5: Diffuse disc bulge with bilateral uncovertebral hypertrophy,
worse on the right. Flattening and partial effacement of the ventral
thecal sac with resultant mild spinal stenosis. Minimal flattening
of the ventral cord without cord signal changes. Moderate right
worse than left C5 foraminal stenosis.

C5-C6: Mild disc bulge with right-sided uncovertebral spurring. No
spinal stenosis. Mild to moderate right C6 foraminal narrowing. Left
neural foramen remains patent.

C6-C7:  Minimal disc bulge.  No canal or foraminal stenosis.

C7-T1:  Unremarkable.

T1-2: Small central disc protrusion without stenosis or cord
deformity. Foramina remain patent.
IMPRESSION: 1. Normal MRI appearance of the cervical spinal cord. No evidence
for myelopathy.
2. Degenerative disc bulge with uncovertebral hypertrophy at C4-5
with resultant mild spinal stenosis, with moderate right worse than
left C5 foraminal narrowing.
3. Right-sided uncovertebral spurring at C5-6 with resultant mild to
moderate right C6 foraminal stenosis.
4. Additional mild noncompressive disc bulging at C3-4 and C6-7
without stenosis or impingement.

## 2021-07-18 IMAGING — MR MR LUMBAR SPINE WO/W CM
5 of 7 series · 30 of 48 positions shown · IV contrast (gadavist)
Comparison: None available.

CLINICAL DATA: Initial evaluation for myelopathy, lower extremity
weakness over past 3 weeks.

EXAM:
MRI LUMBAR SPINE WITHOUT AND WITH CONTRAST
TECHNIQUE: Multiplanar and multiecho pulse sequences of the lumbar spine were
obtained without and with intravenous contrast.
CONTRAST:  10mL GADAVIST GADOBUTROL 1 MMOL/ML IV SOLN

[Series 6: T1 · sagittal · 4.0mm · 0.81mm/px · 3 of 17 slices shown (1 of 2)]
[im 1/17]
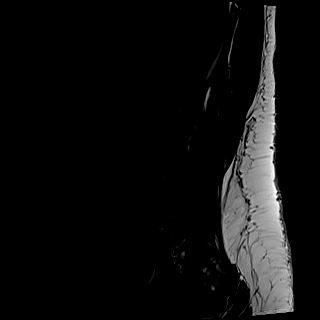
[im 9/17]
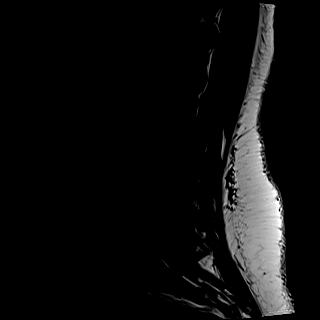
[im 17/17]
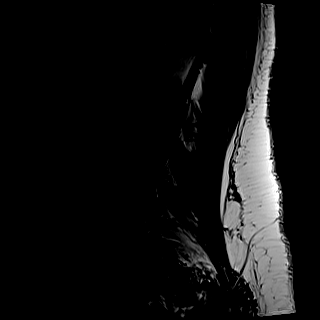

[Series 8: T2 · axial · 4.0mm · 0.62mm/px · z∈[-685,-445]mm · 11 of 48 slices shown]
[im 1/48]
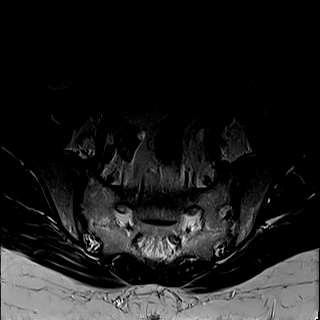
[im 5/48]
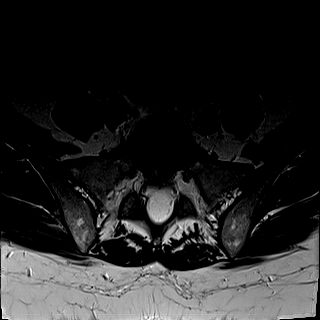
[im 10/48]
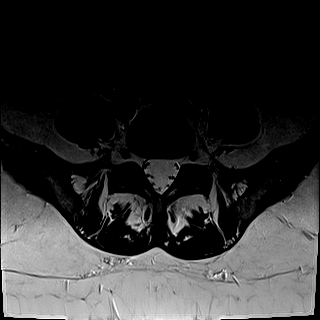
[im 15/48]
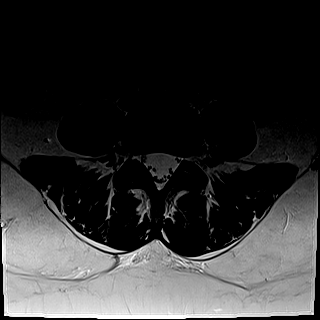
[im 19/48]
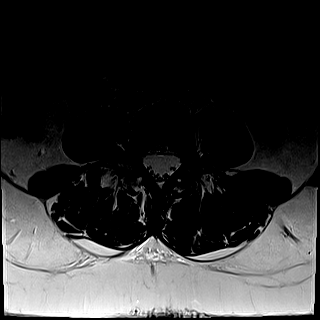
[im 24/48]
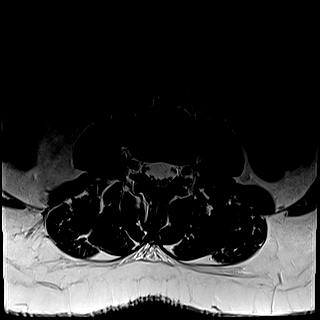
[im 29/48]
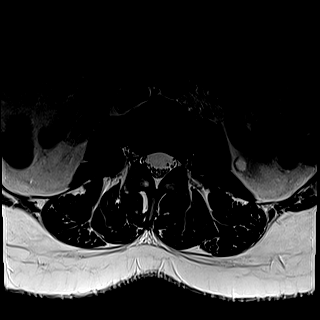
[im 33/48]
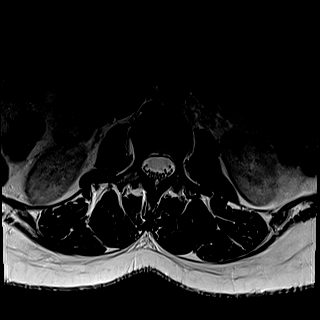
[im 38/48]
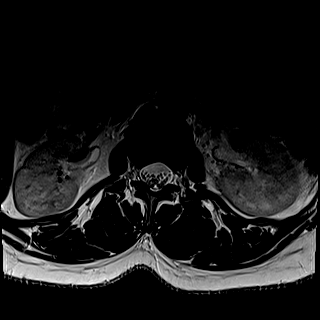
[im 43/48]
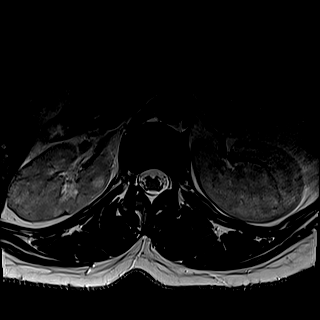
[im 48/48]
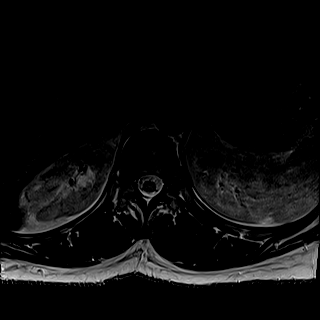

[Series 9: T1 · axial · 4.0mm · 0.39mm/px · z∈[-685,-445]mm · 11 of 48 slices shown (2 of 2)]
[im 1/48]
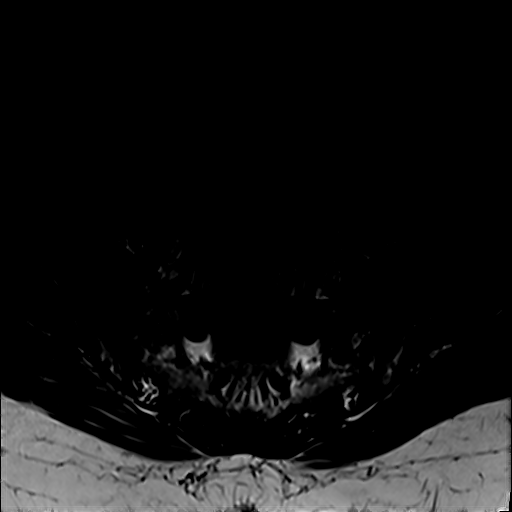
[im 5/48]
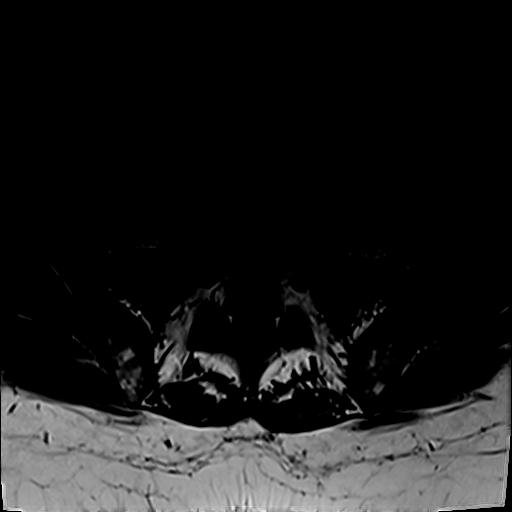
[im 10/48]
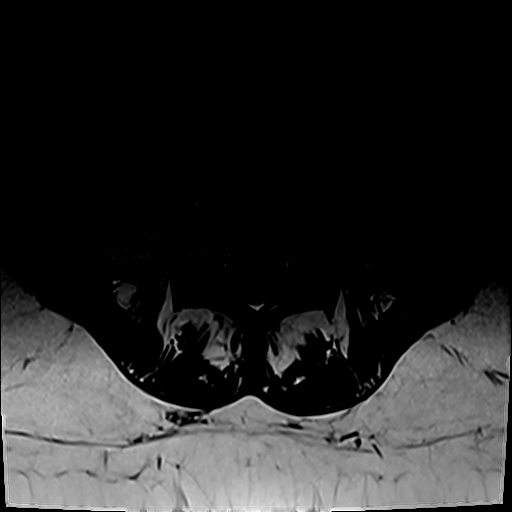
[im 15/48]
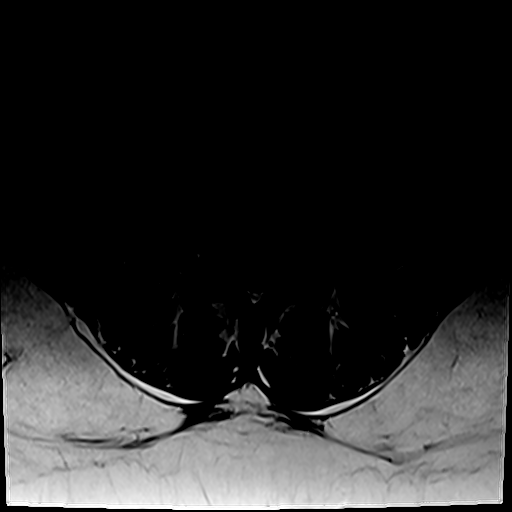
[im 19/48]
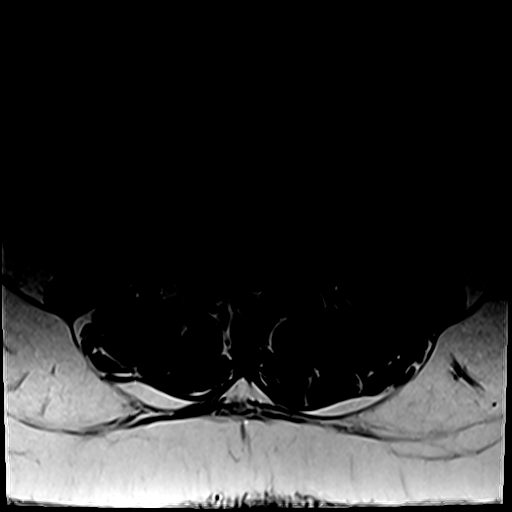
[im 24/48]
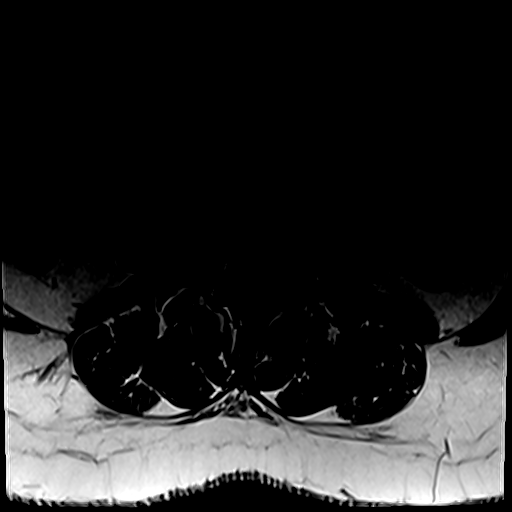
[im 29/48]
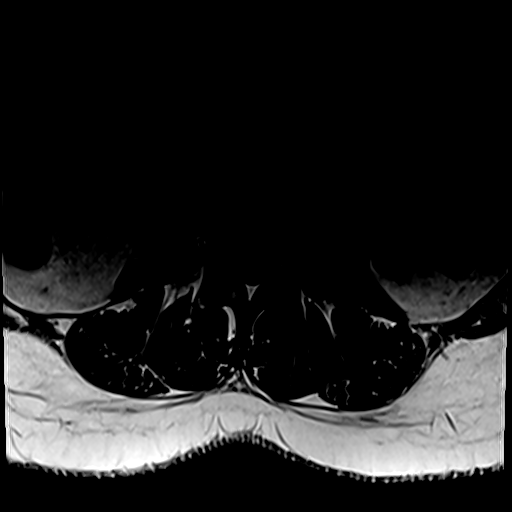
[im 33/48]
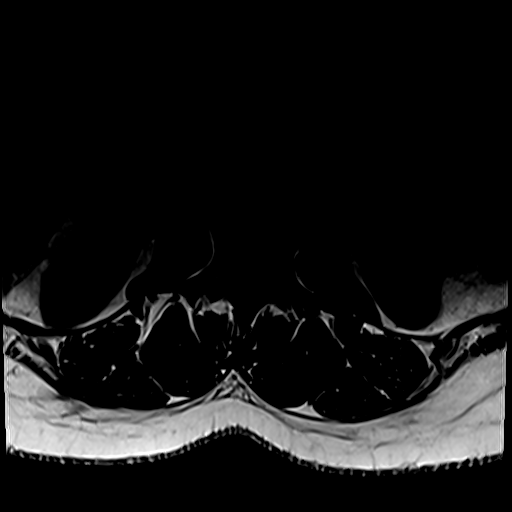
[im 38/48]
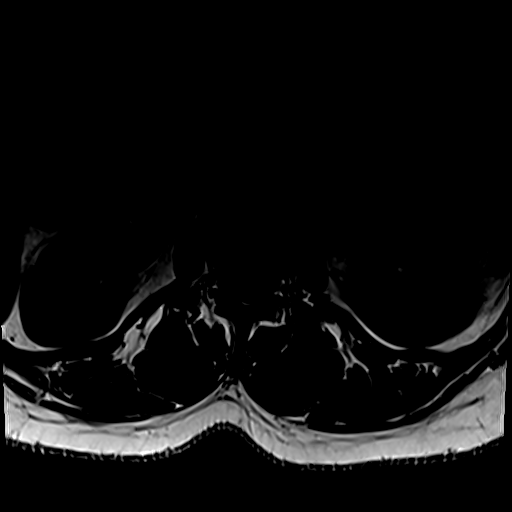
[im 43/48]
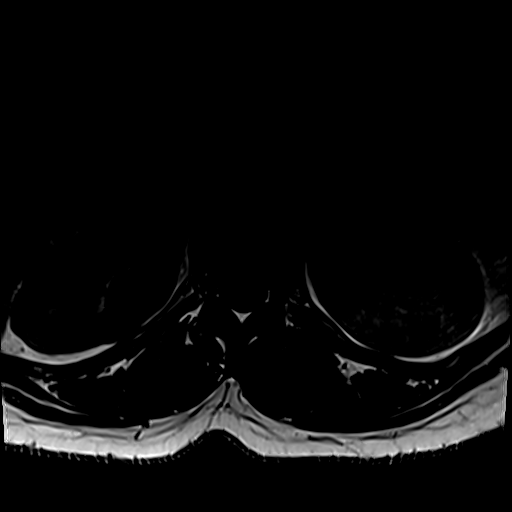
[im 48/48]
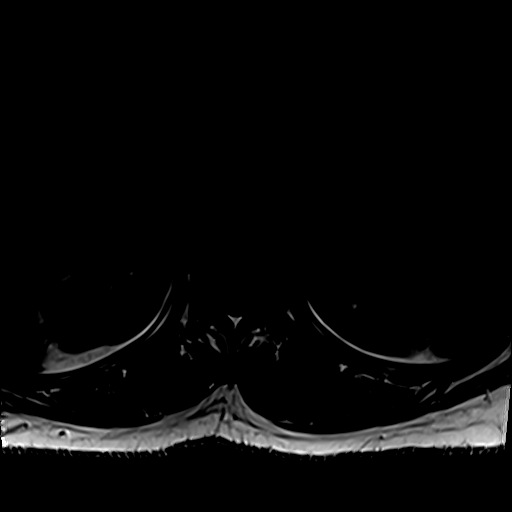

[Series 10: T2 post-contrast · sagittal · 4.0mm · 0.81mm/px · 4 of 17 slices shown]
[im 1/17]
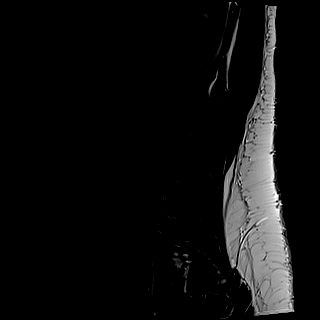
[im 6/17]
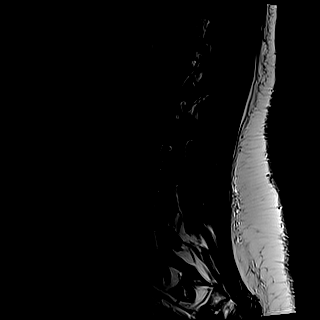
[im 11/17]
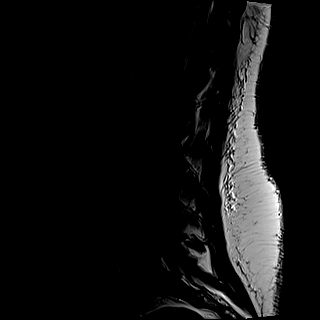
[im 17/17]
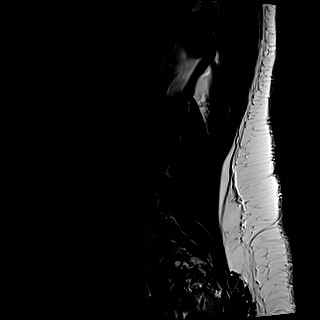

[Series 11: T1 fat-sat post-contrast · sagittal · 4.0mm · 0.81mm/px · 1 of 17 slices shown]
[im 1/17]
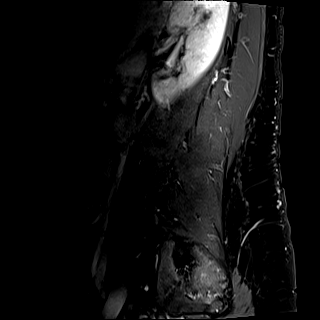

[30 of 48 positions shown; findings below may reference images not displayed]

FINDINGS: Segmentation: Standard. Lowest well-formed disc space labeled the
L5-S1 level.

Alignment: Mild levoscoliosis. Alignment otherwise normal
preservation of the normal lumbar lordosis. No significant
listhesis.

Vertebrae: Vertebral body height maintained without acute or chronic
fracture. Bone marrow signal intensity diffusely decreased on T1
weighted sequence, nonspecific, but most commonly related to anemia,
smoking or obesity. Small benign hemangioma noted within the L2
vertebral body. No worrisome osseous lesions. No abnormal marrow
edema or enhancement.

Conus medullaris and cauda equina: Conus extends to the L1 level.
Conus and cauda equina appear normal. No abnormal enhancement.

Paraspinal and other soft tissues: Paraspinous soft tissues within
normal limits. Few subcentimeter benign appearing renal cysts noted
bilaterally. Visualized visceral structures otherwise unremarkable.

Disc levels:

No significant disc pathology seen within the lumbar spine. No disc
bulge or focal disc herniation. No significant facet pathology. No
canal or neural foraminal stenosis or evidence for neural
impingement.
IMPRESSION: 1. Normal MRI of the conus medullaris and cauda equina. No findings
to explain patient's symptoms.
2. No significant disc pathology within the lumbar spine. No
stenosis or neural impingement.

## 2021-07-18 MED ORDER — HYDROMORPHONE HCL 1 MG/ML IJ SOLN
1.0000 mg | Freq: Once | INTRAMUSCULAR | Status: AC
  Administered 2021-07-18: 17:00:00 1 mg via INTRAVENOUS
  Filled 2021-07-18: qty 1

## 2021-07-18 MED ORDER — OXYCODONE-ACETAMINOPHEN 5-325 MG PO TABS
1.0000 | ORAL_TABLET | Freq: Once | ORAL | Status: AC
  Administered 2021-07-18: 21:00:00 1 via ORAL
  Filled 2021-07-18: qty 1

## 2021-07-18 MED ORDER — ONDANSETRON HCL 4 MG/2ML IJ SOLN
4.0000 mg | Freq: Once | INTRAMUSCULAR | Status: AC
  Administered 2021-07-18: 17:00:00 4 mg via INTRAVENOUS
  Filled 2021-07-18: qty 2

## 2021-07-18 MED ORDER — GADOBUTROL 1 MMOL/ML IV SOLN
10.0000 mL | Freq: Once | INTRAVENOUS | Status: AC | PRN
  Administered 2021-07-18: 10 mL via INTRAVENOUS

## 2021-07-18 MED ORDER — DEXAMETHASONE SODIUM PHOSPHATE 10 MG/ML IJ SOLN
10.0000 mg | Freq: Once | INTRAMUSCULAR | Status: AC
  Administered 2021-07-18: 08:00:00 10 mg via INTRAVENOUS
  Filled 2021-07-18: qty 1

## 2021-07-18 MED ORDER — HYDROMORPHONE HCL 1 MG/ML IJ SOLN
1.0000 mg | Freq: Once | INTRAMUSCULAR | Status: AC
  Administered 2021-07-18: 13:00:00 1 mg via INTRAVENOUS
  Filled 2021-07-18: qty 1

## 2021-07-18 MED ORDER — HYDROMORPHONE HCL 1 MG/ML IJ SOLN
1.0000 mg | Freq: Once | INTRAMUSCULAR | Status: AC
  Administered 2021-07-18: 08:00:00 1 mg via INTRAVENOUS
  Filled 2021-07-18: qty 1

## 2021-07-18 NOTE — ED Notes (Signed)
Pt given oj and gram crackers

## 2021-07-18 NOTE — ED Provider Notes (Signed)
Care assumed from previous provider at shift change.  See note for full HPI.  LE weakness over last 3 week. Initially Bl, RLE resolved however LLE worsened. Seen by PCP, with neg WU including [place] for DVT per patient. Seen by Neuro ( Dr. Billey Gosling) requested ED eval however patient has prior bad experience with wait time and declined. Was attempt to get MR outpatient however sx worsening leading here to ED today. Apparent new right Upper arm pain? Without weakness, Has chronic weakness to LUE from prior injury per patient. No bowel or bladder incontinence, saddle paresthesias. Patient apparently hasn't walked over the last 2 days due to pain and weakness.  1/5 LLE, 5/5 RLE per other provider, normal rectal tone, reflexes?  Apparently exposed to daughter who had COVID, Pt COVID positive here  Plan on FU MR. Due to being COVID positive unable to get MR until 9 PM tonight after all scheduled Mrs for the day per MR tech.  Will tough back with Neuro or Neurosurgery for rec after imaging results. Suspect patient will need admission given worsening ambulation and uncontrolled pain. Physical Exam  BP (!) 146/78    Pulse (!) 52    Temp 98.6 F (37 C) (Oral)    Resp 20    Ht 5\' 9"  (1.753 m)    Wt 95.3 kg    SpO2 97%    BMI 31.01 kg/m   Physical Exam Physical Exam  Constitutional: Pt appears well-developed and well-nourished. No distress.  HENT:  Head: Normocephalic and atraumatic.  Mouth/Throat: Oropharynx is clear and moist. No oropharyngeal exudate.  Eyes: Conjunctivae are normal.  Neck: Normal range of motion. Neck supple.  Full ROM without pain  Cardiovascular: Normal rate, regular rhythm and intact distal pulses.   Pulmonary/Chest: Effort normal and breath sounds normal. No respiratory distress. Pt has no wheezes.  Abdominal: Soft. Pt exhibits no distension. There is no tenderness, rebound or guarding. No abd bruit or pulsatile mass GU: deferred>> performed by previous provider Musculoskeletal:   Full range of motion of the T-spine and L-spine with flexion, hyperextension, and lateral flexion. No midline tenderness or stepoffs. No tenderness to palpation of the spinous processes of the T-spine or L-spine. Diffuse tenderness to palpation of the paraspinous muscles of the L-spine.Unable to raise left lower extremity off the bed.  Range of motion right lower extremity.  Unable to plantarflex, dorsiflex left lower extremity.  Decreased sensation with pinprick left lower extremity.  Decreased strength at great toe bilaterally. Equal hand grip Bl. FULL ROM Bl upper extremities Lymphadenopathy:    Pt has no cervical adenopathy.  Neurological: Pt is alert. Pt has normal reflexes.  Speech is clear and goal oriented, follows commands Normal 5/5 strength in upper extremities. 3/5 strength to LLE, 4/5 strength to RLE Moves extremities without ataxia, coordination intact Unable to ambulate No Clonus Skin: Skin is warm and dry. No rash noted or lesions noted. Pt is not diaphoretic. No erythema, ecchymosis,edema or warmth.  Psychiatric: Pt has a normal mood and affect. Behavior is normal.  Nursing note and vitals reviewed.  ED Course/Procedures     Procedures Labs Reviewed  RESP PANEL BY RT-PCR (FLU A&B, COVID) ARPGX2 - Abnormal; Notable for the following components:      Result Value   SARS Coronavirus 2 by RT PCR POSITIVE (*)    All other components within normal limits  CBC WITH DIFFERENTIAL/PLATELET - Abnormal; Notable for the following components:   WBC 3.6 (*)    Hemoglobin 10.5 (*)  MCH 25.8 (*)    MCHC 29.2 (*)    RDW 17.6 (*)    Neutro Abs 1.6 (*)    All other components within normal limits  BASIC METABOLIC PANEL - Abnormal; Notable for the following components:   Calcium 8.6 (*)    All other components within normal limits  CK  I-STAT BETA HCG BLOOD, ED ([facility], [facility], [facility] ONLY)   VAS [place] LOWER EXTREMITY VENOUS (DVT)  Result Date: 07/05/2021  Lower Venous DVT Study Patient Name:   Paula Jarvis  Date of Exam:   07/05/2021 Medical Rec #: [id number]        Accession #:    [id number] Date of Birth: 1975/12/18       Patient Gender: F Patient Age:   45 years Exam Location:  [facility] Procedure:      VAS [place] LOWER EXTREMITY VENOUS (DVT) Referring Phys: Lorrene Reid --------------------------------------------------------------------------------  Indications: Pain, and Swelling.  Risk Factors: None identified. Limitations: Poor ultrasound/tissue interface. Comparison Study: No prior studies. Performing Technologist: Oliver Hum RVT  Examination Guidelines: A complete evaluation includes B-mode imaging, spectral Doppler, color Doppler, and power Doppler as needed of all accessible portions of each vessel. Bilateral testing is considered an integral part of a complete examination. Limited examinations for reoccurring indications may be performed as noted. The reflux portion of the exam is performed with the patient in reverse Trendelenburg.  +-----+---------------+---------+-----------+----------+--------------+  RIGHT Compressibility Phasicity Spontaneity Properties Thrombus Aging  +-----+---------------+---------+-----------+----------+--------------+  CFV   Full            Yes       Yes                                    +-----+---------------+---------+-----------+----------+--------------+   +---------+---------------+---------+-----------+----------+--------------+  LEFT      Compressibility Phasicity Spontaneity Properties Thrombus Aging  +---------+---------------+---------+-----------+----------+--------------+  CFV       Full            Yes       Yes                                    +---------+---------------+---------+-----------+----------+--------------+  SFJ       Full                                                             +---------+---------------+---------+-----------+----------+--------------+  FV Prox   Full                                                              +---------+---------------+---------+-----------+----------+--------------+  FV Mid    Full                                                             +---------+---------------+---------+-----------+----------+--------------+  FV Distal Full                                                             +---------+---------------+---------+-----------+----------+--------------+  PFV       Full                                                             +---------+---------------+---------+-----------+----------+--------------+  POP       Full            Yes       Yes                                    +---------+---------------+---------+-----------+----------+--------------+  PTV       Full                                                             +---------+---------------+---------+-----------+----------+--------------+  PERO      Full                                                             +---------+---------------+---------+-----------+----------+--------------+    Summary: RIGHT: - No evidence of common femoral vein obstruction.  LEFT: - There is no evidence of deep vein thrombosis in the lower extremity.  - No cystic structure found in the popliteal fossa.  *See table(s) above for measurements and observations. Electronically signed by Orlie Pollen on 07/05/2021 at 5:14:23 PM.    Final     MDM   Plan on FU on MR results, suspect will likely need admission for pain control and weakness, possible Neuro consult?PT/OT eval however dispo per oncoming provider.      Stepanie Graver A, PA-C 07/18/21 [id number]    Lacretia Leigh, MD 07/22/21 [phone]

## 2021-07-18 NOTE — ED Provider Notes (Signed)
Worsening LLE weakness and pain for several weeks and now R arm pain a few days ago.  COVID positive, without sxs.  Awaits MRIs and anticipate consulting with neuro after imaging and likely admission for sxs control, PT/OT as pt is having difficulty ambulating and pain is not well controlled.    2:29 AM Labs are reassuring.  MRI of the brain without any acute intracranial abnormalities of finding to explain space symptoms.  MRI of the cervical thoracic and lumbar spine with normal appearance and no evidence of myelopathy or other abnormalities.  There are some small disc protrusion noted at several thoracic level but without any significant stenosis or neural impingement.  There are degenerative disc bulge with uncovertebral hypertrophy at C4-5 with resultant mild spinal stenosis with moderate right worse than left C5 foraminal narrowing.  No significant disc pathology within the lumbar spine  Patient still endorses pain to her right upper arm, pain weakness involving her left lower extremity and difficulty ambulation due to his symptoms.  2:46 AM Appreciate consultation from on-call neurologist, Dr. Carolin Guernsey who agrees with have patient admitted for PT OT.  He recommend for the inpatient team to consult neurology in the morning for further assessment.  2:59 AM Appreciate consultation from [facility] who agrees to see and will admit pt for further evaluation and pain management.    BP (!) 155/91 (BP Location: Left Arm)    Pulse 63    Temp 98.6 F (37 C) (Oral)    Resp 17    Ht 5\' 9"  (1.753 m)    Wt 95.3 kg    SpO2 98%    BMI 31.01 kg/m   Results for orders placed or performed during the hospital encounter of 07/18/21  Resp Panel by RT-PCR (Flu A&B, Covid) Nasopharyngeal Swab   Specimen: Nasopharyngeal Swab; Nasopharyngeal(NP) swabs in vial transport medium  Result Value Ref Range   SARS Coronavirus 2 by RT PCR POSITIVE (A) NEGATIVE   Influenza A by PCR NEGATIVE NEGATIVE   Influenza B  by PCR NEGATIVE NEGATIVE  CK  Result Value Ref Range   Total CK 59 38 - 234 U/L  CBC with Differential  Result Value Ref Range   WBC 3.6 (L) 4.0 - 10.5 K/uL   RBC 4.07 3.87 - 5.11 MIL/uL   Hemoglobin 10.5 (L) 12.0 - 15.0 g/dL   HCT 36.0 36.0 - 46.0 %   MCV 88.5 80.0 - 100.0 fL   MCH 25.8 (L) 26.0 - 34.0 pg   MCHC 29.2 (L) 30.0 - 36.0 g/dL   RDW 17.6 (H) 11.5 - 15.5 %   Platelets 356 150 - 400 K/uL   nRBC 0.0 0.0 - 0.2 %   Neutrophils Relative % 44 %   Neutro Abs 1.6 (L) 1.7 - 7.7 K/uL   Lymphocytes Relative 38 %   Lymphs Abs 1.4 0.7 - 4.0 K/uL   Monocytes Relative 17 %   Monocytes Absolute 0.6 0.1 - 1.0 K/uL   Eosinophils Relative 1 %   Eosinophils Absolute 0.0 0.0 - 0.5 K/uL   Basophils Relative 0 %   Basophils Absolute 0.0 0.0 - 0.1 K/uL   Immature Granulocytes 0 %   Abs Immature Granulocytes 0.00 0.00 - 0.07 K/uL  Basic metabolic panel  Result Value Ref Range   Sodium 138 135 - 145 mmol/L   Potassium 4.1 3.5 - 5.1 mmol/L   Chloride 107 98 - 111 mmol/L   CO2 26 22 - 32 mmol/L   Glucose, Bld  95 70 - 99 mg/dL   BUN 9 6 - 20 mg/dL   Creatinine, Ser 0.73 0.44 - 1.00 mg/dL   Calcium 8.6 (L) 8.9 - 10.3 mg/dL   GFR, Estimated >60 >60 mL/min   Anion gap 5 5 - 15  I-Stat Beta hCG blood, ED ([facility], [facility], [facility] only)  Result Value Ref Range   I-stat hCG, quantitative <5.0 <5 mIU/mL   Comment 3           MR Brain W and Wo Contrast  Result Date: 07/19/2021 CLINICAL DATA:  Initial evaluation for myelopathy, lower extremity weakness over past 3 weeks, new right upper extremity arm pain. EXAM: MRI HEAD WITHOUT AND WITH CONTRAST TECHNIQUE: Multiplanar, multiecho pulse sequences of the brain and surrounding structures were obtained without and with intravenous contrast. CONTRAST:  66mL GADAVIST GADOBUTROL 1 MMOL/ML IV SOLN COMPARISON:  None. FINDINGS: Brain: Cerebral volume within normal limits for patient age. Single punctate focus of FLAIR hyperintensity noted within the periventricular  white matter of the anterior left frontal lobe (series 12, image 29), nonspecific, but of doubtful significance in isolation. No other focal parenchymal signal abnormality. No abnormal foci of restricted diffusion to suggest acute or subacute ischemia. Gray-white matter differentiation well maintained. No encephalomalacia to suggest chronic infarction. No foci of susceptibility artifact to suggest acute or chronic intracranial hemorrhage. No mass lesion, midline shift or mass effect. No hydrocephalus. No extra-axial fluid collection. Major dural sinuses are grossly patent. Pituitary gland and suprasellar region are normal. Midline structures intact and normal. No abnormal enhancement. Vascular: Major intracranial vascular flow voids well maintained and normal in appearance. Skull and upper cervical spine: Craniocervical junction normal. Visualized upper cervical spine within normal limits. Bone marrow signal intensity diffusely decreased on T1 weighted sequence, nonspecific, but most commonly related to anemia, smoking or obesity. 1.4 cm T2 hypointense nodular lesion at the left parietal scalp (series 9, image 24), nonspecific, but likely benign. Scalp soft tissues otherwise unremarkable. Sinuses/Orbits: Globes and orbital soft tissues within normal limits. Mild scattered mucosal thickening noted within the ethmoidal air cells and maxillary sinuses. Paranasal sinuses are otherwise clear. No mastoid effusion. Inner ear structures grossly normal. Other: None. IMPRESSION: Normal brain MRI. No acute intracranial abnormality or findings to explain patient's symptoms. Electronically Signed   By: Jeannine Boga M.D.   On: 07/19/2021 01:11   MR Cervical Spine W or Wo Contrast  Result Date: 07/19/2021 CLINICAL DATA:  Initial evaluation for chronic neck pain, myelopathy, lower extremity weakness over past 3 weeks. EXAM: MRI CERVICAL SPINE WITHOUT AND WITH CONTRAST TECHNIQUE: Multiplanar and multiecho pulse  sequences of the cervical spine, to include the craniocervical junction and cervicothoracic junction, were obtained without and with intravenous contrast. CONTRAST:  16mL GADAVIST GADOBUTROL 1 MMOL/ML IV SOLN COMPARISON:  None available. FINDINGS: Alignment: Straightening of the normal cervical lordosis. No listhesis. Vertebrae: Vertebral body height maintained without acute or chronic fracture. Bone marrow signal intensity diffusely decreased on T1 weighted sequence, nonspecific, but most commonly related to anemia, smoking or obesity. No discrete or worrisome osseous lesions. No abnormal marrow edema or enhancement. Cord: Normal signal morphology.  No abnormal enhancement. Posterior Fossa, vertebral arteries, paraspinal tissues: Visualized brain and posterior fossa within normal limits. Craniocervical junction normal. Paraspinous and prevertebral soft tissues within normal limits. Normal intravascular flow voids seen within the vertebral arteries bilaterally. Disc levels: C2-C3: Mild endplate spurring without significant disc bulge. No canal or foraminal stenosis. C3-C4: Mild disc bulge with endplate and uncovertebral spurring. Flattening of  the ventral thecal sac without significant spinal stenosis or cord deformity. Foramina remain patent. C4-C5: Diffuse disc bulge with bilateral uncovertebral hypertrophy, worse on the right. Flattening and partial effacement of the ventral thecal sac with resultant mild spinal stenosis. Minimal flattening of the ventral cord without cord signal changes. Moderate right worse than left C5 foraminal stenosis. C5-C6: Mild disc bulge with right-sided uncovertebral spurring. No spinal stenosis. Mild to moderate right C6 foraminal narrowing. Left neural foramen remains patent. C6-C7:  Minimal disc bulge.  No canal or foraminal stenosis. C7-T1:  Unremarkable. T1-2: Small central disc protrusion without stenosis or cord deformity. Foramina remain patent. IMPRESSION: 1. Normal MRI  appearance of the cervical spinal cord. No evidence for myelopathy. 2. Degenerative disc bulge with uncovertebral hypertrophy at C4-5 with resultant mild spinal stenosis, with moderate right worse than left C5 foraminal narrowing. 3. Right-sided uncovertebral spurring at C5-6 with resultant mild to moderate right C6 foraminal stenosis. 4. Additional mild noncompressive disc bulging at C3-4 and C6-7 without stenosis or impingement. Electronically Signed   By: Jeannine Boga M.D.   On: 07/19/2021 01:19   MR THORACIC SPINE W WO CONTRAST  Result Date: 07/19/2021 CLINICAL DATA:  Initial evaluation for myelopathy, lower extremity weakness over past 3 weeks. EXAM: MRI THORACIC WITHOUT AND WITH CONTRAST TECHNIQUE: Multiplanar and multiecho pulse sequences of the thoracic spine were obtained without and with intravenous contrast. CONTRAST:  13mL GADAVIST GADOBUTROL 1 MMOL/ML IV SOLN COMPARISON:  None available. FINDINGS: Alignment: Trace dextroscoliosis. Alignment otherwise normal with preservation of the normal thoracic kyphosis. No listhesis. Vertebrae: Vertebral body height maintained without acute or chronic fracture. Bone marrow signal intensity diffusely decreased on T1 weighted sequence, nonspecific, but most commonly related to anemia, smoking or obesity. No worrisome osseous lesions. No abnormal marrow edema or enhancement. Cord:  Normal signal morphology.  No abnormal enhancement. Paraspinal and other soft tissues: Paraspinous soft tissues demonstrate no acute finding. Trace layering bilateral pleural effusions noted. 7 mm simple cyst present at the hepatic dome. Probable cholelithiasis noted (series 20, image 39). Disc levels: T1-2: Tiny central/right paracentral disc protrusion minimally indents the ventral thecal sac. No significant spinal stenosis or cord deformity. Foramina remain patent. T2-3: Unremarkable. T3-4: Unremarkable. T4-5: Unremarkable. T5-6: Unremarkable. T6-7: Shallow left paracentral  disc protrusion mildly flattens the left ventral thecal sac. No significant spinal stenosis or cord deformity. Foramina remain patent. T7-8: Shallow left paracentral disc protrusion mildly flattens the left ventral thecal sac. No significant spinal stenosis or cord deformity. Foramina remain patent. T8-9: Shallow left paracentral disc protrusion mildly indents the ventral thecal sac. No significant spinal stenosis or cord deformity. Foramina remain patent. T9-10: Small left paracentral to foraminal disc protrusion mildly flattens the left ventral thecal sac. No significant spinal stenosis or cord deformity. Foramina remain patent. T10-11: Normal interspace.  Mild facet hypertrophy.  No stenosis. T11-12: Normal interspace. Mild left-sided facet hypertrophy. No stenosis. T12-L1: Unremarkable. IMPRESSION: 1. Normal MRI appearance of the thoracic spinal cord. No evidence for myelopathy or other abnormality. 2. Mild for age degenerative spondylosis with small disc protrusions at T1-2 and T6-7 through T9-10 as above. No significant stenosis or neural impingement. 3. Trace layering bilateral pleural effusions. 4. Probable cholelithiasis. Electronically Signed   By: Jeannine Boga M.D.   On: 07/19/2021 01:31   MR Lumbar Spine W Wo Contrast  Result Date: 07/19/2021 CLINICAL DATA:  Initial evaluation for myelopathy, lower extremity weakness over past 3 weeks. EXAM: MRI LUMBAR SPINE WITHOUT AND WITH CONTRAST TECHNIQUE: Multiplanar and multiecho  pulse sequences of the lumbar spine were obtained without and with intravenous contrast. CONTRAST:  51mL GADAVIST GADOBUTROL 1 MMOL/ML IV SOLN COMPARISON:  None available. FINDINGS: Segmentation: Standard. Lowest well-formed disc space labeled the L5-S1 level. Alignment: Mild levoscoliosis. Alignment otherwise normal preservation of the normal lumbar lordosis. No significant listhesis. Vertebrae: Vertebral body height maintained without acute or chronic fracture. Bone marrow  signal intensity diffusely decreased on T1 weighted sequence, nonspecific, but most commonly related to anemia, smoking or obesity. Small benign hemangioma noted within the L2 vertebral body. No worrisome osseous lesions. No abnormal marrow edema or enhancement. Conus medullaris and cauda equina: Conus extends to the L1 level. Conus and cauda equina appear normal. No abnormal enhancement. Paraspinal and other soft tissues: Paraspinous soft tissues within normal limits. Few subcentimeter benign appearing renal cysts noted bilaterally. Visualized visceral structures otherwise unremarkable. Disc levels: No significant disc pathology seen within the lumbar spine. No disc bulge or focal disc herniation. No significant facet pathology. No canal or neural foraminal stenosis or evidence for neural impingement. IMPRESSION: 1. Normal MRI of the conus medullaris and cauda equina. No findings to explain patient's symptoms. 2. No significant disc pathology within the lumbar spine. No stenosis or neural impingement. Electronically Signed   By: Jeannine Boga M.D.   On: 07/19/2021 01:39   VAS [place] LOWER EXTREMITY VENOUS (DVT)  Result Date: 07/05/2021  Lower Venous DVT Study Patient Name:  Paula Jarvis  Date of Exam:   07/05/2021 Medical Rec #: [id number]        Accession #:    [id number] Date of Birth: [birth year]       Patient Gender: F Patient Age:   45 years Exam Location:  [facility] Procedure:      VAS [place] LOWER EXTREMITY VENOUS (DVT) Referring Phys: Lorrene Reid --------------------------------------------------------------------------------  Indications: Pain, and Swelling.  Risk Factors: None identified. Limitations: Poor ultrasound/tissue interface. Comparison Study: No prior studies. Performing Technologist: Oliver Hum RVT  Examination Guidelines: A complete evaluation includes B-mode imaging, spectral Doppler, color Doppler, and power Doppler as needed of all accessible portions of each vessel.  Bilateral testing is considered an integral part of a complete examination. Limited examinations for reoccurring indications may be performed as noted. The reflux portion of the exam is performed with the patient in reverse Trendelenburg.  +-----+---------------+---------+-----------+----------+--------------+  RIGHT Compressibility Phasicity Spontaneity Properties Thrombus Aging  +-----+---------------+---------+-----------+----------+--------------+  CFV   Full            Yes       Yes                                    +-----+---------------+---------+-----------+----------+--------------+   +---------+---------------+---------+-----------+----------+--------------+  LEFT      Compressibility Phasicity Spontaneity Properties Thrombus Aging  +---------+---------------+---------+-----------+----------+--------------+  CFV       Full            Yes       Yes                                    +---------+---------------+---------+-----------+----------+--------------+  SFJ       Full                                                             +---------+---------------+---------+-----------+----------+--------------+  FV Prox   Full                                                             +---------+---------------+---------+-----------+----------+--------------+  FV Mid    Full                                                             +---------+---------------+---------+-----------+----------+--------------+  FV Distal Full                                                             +---------+---------------+---------+-----------+----------+--------------+  PFV       Full                                                             +---------+---------------+---------+-----------+----------+--------------+  POP       Full            Yes       Yes                                    +---------+---------------+---------+-----------+----------+--------------+  PTV       Full                                                              +---------+---------------+---------+-----------+----------+--------------+  PERO      Full                                                             +---------+---------------+---------+-----------+----------+--------------+    Summary: RIGHT: - No evidence of common femoral vein obstruction.  LEFT: - There is no evidence of deep vein thrombosis in the lower extremity.  - No cystic structure found in the popliteal fossa.  *See table(s) above for measurements and observations. Electronically signed by Orlie Pollen on 07/05/2021 at 5:14:23 PM.    Final       Domenic Moras, PA-C 07/19/21 0300    Malvin Johns, MD 07/19/21 [phone]

## 2021-07-18 NOTE — ED Provider Notes (Signed)
[facility] DEPT Provider Note   CSN: [id number] Arrival date & time: 07/18/21  0419     History Chief Complaint  Patient presents with   Leg Pain   Back Pain    Paula Jarvis is a 45 y.o. female with medical history significant for sciatica.  Patient presents to ED with continuation of low back pain, left leg swelling, bilateral burning of feet and lower extremity weakness.  Patient states that this issue began on the "Saturday after Thanksgiving "and she was seen at the [facility], ED on 11/30 for the symptoms.  Patient was triaged and left without being seen after a 10-hour wait.  Patient states that she reported to her PCP who did an extensive work-up including lower extremity DVT ultrasound.  There was no evidence of DVT seen on ultrasound.  Only abnormal result was a slightly increased A1c at 5.7 and a slightly decreased TSH.  Patient pain has attempted to be controlled with gabapentin, Medrol Dosepak, duloxetine.  Patient states that these medications simply make her sleepy and the pain always returns.  Patient was referred to a neurologist by her PCP and was set to undergo MRI of her lumbar spine but has not had the imaging yet.  Patient reports to the ED this morning with ongoing symptoms.  Patient states initially her left leg and lower back were in pain and now both legs are in pain along with the upper half of her right arm.  Patient states that she is able to walk, but she feels weak. Patient describes a sensation and pain as "burning, aching ".  Patient has a history of sciatica.  Patient denies groin numbness, bowel/bladder incontinence.  Patient endorses weakness to lower extremities.   Leg Pain Associated symptoms: back pain   Back Pain Associated symptoms: leg pain and weakness   Associated symptoms: no numbness       Past Medical History:  Diagnosis Date   Thrombocytosis     There are no problems to display for this  patient.   Past Surgical History:  Procedure Laterality Date   TUBAL LIGATION       OB History   No obstetric history on file.     History reviewed. No pertinent family history.  Social History   Tobacco Use   Smoking status: Never   Smokeless tobacco: Never  Substance Use Topics   Alcohol use: No   Drug use: No    Home Medications Prior to Admission medications   Medication Sig Start Date End Date Taking? Authorizing Provider  DULoxetine (CYMBALTA) 20 MG capsule Take 1 capsule (20 mg total) by mouth daily. 07/10/21  Yes Abonza, Herb Grays, PA-C  methylPREDNISolone (MEDROL DOSEPAK) 4 MG TBPK tablet Take as directed on pack Patient not taking: Reported on 07/18/2021 07/05/21   Lorrene Reid, PA-C    Allergies    Patient has no known allergies.  Review of Systems   Review of Systems  Musculoskeletal:  Positive for back pain.  Neurological:  Positive for weakness. Negative for numbness.  All other systems reviewed and are negative.  Physical Exam Updated Vital Signs BP (!) 142/82    Pulse 62    Temp 98.6 F (37 C) (Oral)    Resp 18    Ht 5\' 9"  (1.753 m)    Wt 95.3 kg    SpO2 96%    BMI 31.01 kg/m   Physical Exam Vitals and nursing note reviewed.  Constitutional:  General: She is in acute distress.     Appearance: She is not toxic-appearing.  HENT:     Head: Normocephalic.     Mouth/Throat:     Mouth: Mucous membranes are moist.  Eyes:     Pupils: Pupils are equal, round, and reactive to light.  Cardiovascular:     Rate and Rhythm: Normal rate and regular rhythm.     Pulses:          Radial pulses are 2+ on the right side and 2+ on the left side.       Dorsalis pedis pulses are 2+ on the right side and 2+ on the left side.  Pulmonary:     Effort: Pulmonary effort is normal.     Breath sounds: Normal breath sounds. No wheezing.  Abdominal:     General: Abdomen is flat.     Palpations: Abdomen is soft.     Tenderness: There is no abdominal tenderness.   Musculoskeletal:     Cervical back: Normal range of motion.  Skin:    General: Skin is warm and dry.     Capillary Refill: Capillary refill takes less than 2 seconds.  Neurological:     General: No focal deficit present.     Mental Status: She is alert.     GCS: GCS eye subscore is 4. GCS verbal subscore is 5. GCS motor subscore is 6.     Cranial Nerves: Cranial nerves 2-12 are intact. No cranial nerve deficit.     Motor: Weakness present.     Coordination: Coordination normal.     Gait: Gait abnormal.     Comments: Patient has decreased strength to the left lower extremity 2 out of 5 Strength intact right lower extremity 5 out of 5    ED Results / Procedures / Treatments   Labs (all labs ordered are listed, but only abnormal results are displayed) Labs Reviewed  RESP PANEL BY RT-PCR (FLU A&B, COVID) ARPGX2 - Abnormal; Notable for the following components:      Result Value   SARS Coronavirus 2 by RT PCR POSITIVE (*)    All other components within normal limits  CK  CBC WITH DIFFERENTIAL/PLATELET  BASIC METABOLIC PANEL  I-STAT BETA HCG BLOOD, ED ([facility], [facility], [facility] ONLY)    EKG None  Radiology No results found.  Procedures Procedures   Medications Ordered in ED Medications  HYDROmorphone (DILAUDID) injection 1 mg (1 mg Intravenous Given 07/18/21 0753)  dexamethasone (DECADRON) injection 10 mg (10 mg Intravenous Given 07/18/21 0753)  HYDROmorphone (DILAUDID) injection 1 mg (1 mg Intravenous Given 07/18/21 1244)    ED Course  I have reviewed the triage vital signs and the nursing notes.  Pertinent labs & imaging results that were available during my care of the patient were reviewed by me and considered in my medical decision making (see chart for details).    MDM Rules/Calculators/A&P                          45 year old female presenting with 4 weeks of symptoms with predominantly left-sided lower extremity pain and back pain, patient also experiencing problems with  weakness and numbness.  There have been some waxing and waning of symptoms but much worse recently patient states patient stated that this morning she also woke up with pain in her right leg along with pain and weakness in her right upper arm.    This patient has been seen  by neurology with her current plan for lumbar and thoracic MRIs.  Given patient complaint and onset of new arm pain today, we will proceed with the MRI of the C-spine, thoracic and lumbar spine.  We will plan for pain control with Dilaudid and initiate steroids with 10 mg Decadron IV.  Patient is alert, clear mental status.  Patient has no weakness of upper extremities.  Radial pulses 2+ and symmetric.  Patient endorses difficulty as well as pain elevating or moving the left lower extremity.  DP pulses are 2+ and strong bilaterally.    This patient has also had a direct exposure to COVID-19 from her daughter.  She denies any fevers, chills or symptoms related to this but she has tested positive per the respiratory panel conducted here.  I wish to evaluate this patient and her progression of symptoms utilizing the MRI but due to her COVID-positive status the MRI technician has informed me that she would not be able to be imaged until the end of the day due to the need to shut down the machine to clean it after a patient with COVID has been inside.  I discussed this with the patient and she has agreed to stay the entire day in order to receive her MRI.  At my end of shift, this patient was handed off to [facility] for further care. Patient stable at time of discharge.     Final Clinical Impression(s) / ED Diagnoses Final diagnoses:  Left leg pain    Rx / DC Orders ED Discharge Orders     None        Lawana Chambers 07/18/21 1532    Charlesetta Shanks, MD 07/20/21 2117

## 2021-07-18 NOTE — ED Provider Notes (Signed)
I provided a substantive portion of the care of this patient.  I personally performed the entirety of the history for this encounter.     Patient has had over 4 weeks of symptoms with predominantly left lower extremity pain and back pain she is also experienced problems with weakness and numbness.  There have been some waxing and waning of symptoms but much worse recently.  She reports she is also starting to note some pain in the right leg as well.  Also she is experiencing some pain in the numbness of the right upper arm.  That symptoms just started within the past day.  Patient has had direct exposure to COVID.  But herself has not had fever chills or general constitutional symptoms.  Patient is alert.  Mental status clear.  No respiratory distress.  No weakness of upper extremities.  Radial pulses 2+ and symmetric.  Patient endorses a lot of difficulty as well as pain elevating or moving the left lower extremity.  Dorsalis pedis pulses are 2+ and strong.  Feet are warm and dry.  Patient have been seen by neurology.  Plan at that time was for lumbar and thoracic MRIs.  At this time given symptoms and also now today noting of potential arm symptoms we will proceed with MRI C-spine, thoracic and lumbar.  Plan for pain control with Dilaudid and initiate steroids with Decadron 10 mg IV.  I agree with plan of management.   Charlesetta Shanks, MD 07/18/21 [phone]

## 2021-07-18 NOTE — ED Notes (Signed)
Meal tray was given.

## 2021-07-18 NOTE — Telephone Encounter (Signed)
I called to check the status it is still pending.

## 2021-07-18 NOTE — ED Triage Notes (Signed)
Pt reports with bilateral leg pain that radiates to her back that started in November. Pt states that the bottom of her feet are burning.

## 2021-07-19 ENCOUNTER — Encounter (HOSPITAL_COMMUNITY): Payer: Self-pay | Admitting: Internal Medicine

## 2021-07-19 ENCOUNTER — Ambulatory Visit: Payer: Commercial Managed Care - PPO | Admitting: Physician Assistant

## 2021-07-19 DIAGNOSIS — R29898 Other symptoms and signs involving the musculoskeletal system: Secondary | ICD-10-CM | POA: Diagnosis present

## 2021-07-19 DIAGNOSIS — Z6831 Body mass index (BMI) 31.0-31.9, adult: Secondary | ICD-10-CM

## 2021-07-19 DIAGNOSIS — U071 COVID-19: Secondary | ICD-10-CM

## 2021-07-19 DIAGNOSIS — M7062 Trochanteric bursitis, left hip: Secondary | ICD-10-CM | POA: Diagnosis not present

## 2021-07-19 LAB — TROPONIN I (HIGH SENSITIVITY)
Troponin I (High Sensitivity): 3 ng/L (ref <18)
Troponin I (High Sensitivity): 3 ng/L (ref <18)

## 2021-07-19 LAB — PROCALCITONIN: Procalcitonin: <0.1 ng/mL

## 2021-07-19 LAB — LACTATE DEHYDROGENASE: LDH: 165 U/L (ref 98–192)

## 2021-07-19 LAB — FERRITIN: Ferritin: 6 ng/mL — ABNORMAL LOW (ref 11–307)

## 2021-07-19 LAB — SEDIMENTATION RATE: Sed Rate: 30 mm/hr — ABNORMAL HIGH (ref 0–22)

## 2021-07-19 LAB — C-REACTIVE PROTEIN
CRP: 0.6 mg/dL (ref <1.0)
CRP: 0.6 mg/dL (ref <1.0)

## 2021-07-19 LAB — HEPATITIS B SURFACE ANTIGEN: Hepatitis B Surface Ag: NONREACTIVE

## 2021-07-19 LAB — D-DIMER, QUANTITATIVE: D-Dimer, Quant: 0.37 ug/mL-FEU (ref 0.00–0.50)

## 2021-07-19 MED ORDER — NIRMATRELVIR/RITONAVIR (PAXLOVID)TABLET
3.0000 | ORAL_TABLET | Freq: Two times a day (BID) | ORAL | Status: DC
  Filled 2021-07-19: qty 30

## 2021-07-19 MED ORDER — ENOXAPARIN SODIUM 40 MG/0.4ML IJ SOSY: 40.0000 mg | PREFILLED_SYRINGE | INTRAMUSCULAR | Status: DC

## 2021-07-19 MED ORDER — HEPARIN SODIUM (PORCINE) 5000 UNIT/ML IJ SOLN
5000.0000 [IU] | Freq: Three times a day (TID) | INTRAMUSCULAR | Status: DC
  Administered 2021-07-19 – 2021-07-20 (×2): 5000 [IU] via SUBCUTANEOUS
  Filled 2021-07-19 (×2): qty 1

## 2021-07-19 MED ORDER — ACETAMINOPHEN 325 MG PO TABS: 650.0000 mg | ORAL_TABLET | Freq: Four times a day (QID) | ORAL | Status: DC | PRN

## 2021-07-19 MED ORDER — DEXAMETHASONE SODIUM PHOSPHATE 10 MG/ML IJ SOLN
10.0000 mg | Freq: Once | INTRAMUSCULAR | Status: AC
  Administered 2021-07-19: 04:00:00 10 mg via INTRAVENOUS
  Filled 2021-07-19: qty 1

## 2021-07-19 MED ORDER — FOLIC ACID 1 MG PO TABS
1.0000 mg | ORAL_TABLET | Freq: Every day | ORAL | Status: DC
  Administered 2021-07-19 – 2021-07-20 (×2): 1 mg via ORAL
  Filled 2021-07-19 (×2): qty 1

## 2021-07-19 MED ORDER — SODIUM CHLORIDE 0.9 % IV SOLN
200.0000 mg | Freq: Once | INTRAVENOUS | Status: DC
  Filled 2021-07-19: qty 40

## 2021-07-19 MED ORDER — ACETAMINOPHEN 650 MG RE SUPP: 650.0000 mg | Freq: Four times a day (QID) | RECTAL | Status: DC | PRN

## 2021-07-19 MED ORDER — SODIUM CHLORIDE 0.9 % IV SOLN: 100.0000 mg | Freq: Every day | INTRAVENOUS | Status: DC

## 2021-07-19 MED ORDER — HYDROMORPHONE HCL 1 MG/ML IJ SOLN
1.0000 mg | Freq: Once | INTRAMUSCULAR | Status: AC
  Administered 2021-07-19: 01:00:00 1 mg via INTRAVENOUS
  Filled 2021-07-19: qty 1

## 2021-07-19 MED ORDER — PREGABALIN 25 MG PO CAPS
25.0000 mg | ORAL_CAPSULE | Freq: Two times a day (BID) | ORAL | Status: DC
Start: 2021-07-19 — End: 2021-07-20
  Administered 2021-07-19 – 2021-07-20 (×3): 25 mg via ORAL
  Filled 2021-07-19 (×3): qty 1

## 2021-07-19 MED ORDER — ONDANSETRON HCL 4 MG/2ML IJ SOLN
4.0000 mg | Freq: Four times a day (QID) | INTRAMUSCULAR | Status: DC | PRN
  Administered 2021-07-20: 11:00:00 4 mg via INTRAVENOUS
  Filled 2021-07-19: qty 2

## 2021-07-19 MED ORDER — THIAMINE HCL 100 MG PO TABS
100.0000 mg | ORAL_TABLET | Freq: Every day | ORAL | Status: DC
  Administered 2021-07-19 – 2021-07-20 (×2): 100 mg via ORAL
  Filled 2021-07-19 (×2): qty 1

## 2021-07-19 MED ORDER — METHYLPREDNISOLONE SODIUM SUCC 125 MG IJ SOLR: 125.0000 mg | Freq: Once | INTRAMUSCULAR | Status: DC

## 2021-07-19 MED ORDER — ONDANSETRON HCL 4 MG PO TABS: 4.0000 mg | ORAL_TABLET | Freq: Four times a day (QID) | ORAL | Status: DC | PRN

## 2021-07-19 NOTE — Progress Notes (Addendum)
Pharmacy: heparin for VTE prophylaxis  Patient is a 45 y.o F presented to the ED on 07/18/21 with LE and back pain. LE doppler was negative for DVT. She was also found to have COVID-19.  Pharmacy has been consulted to dose heparin for VTE prophylaxis.   Plan: - heparin 5000 units SQ q8h - pharmacy will sign off. Reconsult [place] if need further assistancw  Dia Sitter, PharmD, BCPS 07/19/2021 9:30 PM

## 2021-07-19 NOTE — Assessment & Plan Note (Signed)
Assigned observation status.  MRI of brain, cervical, thoracic and lumbar spine did not reveal a cause for why the patient would have bilateral lower extremity weakness along with right arm weakness.  Patient thinks that her right arm weakness may have been due to her sleeping on her right side now because the left side is still sore.  Awaiting in-house neurologic consultation.  Patient may need outpatient EMG study.  Patient has no prior history of multiple sclerosis.  There is no family history of MS.  MRI of the brain was negative for any demyelinating disease.  Defer to neurology if the patient would benefit from lumbar puncture.

## 2021-07-19 NOTE — Consult Note (Signed)
Neurology Consultation  Reason for Consult: Bilateral leg pain with weakness Referring Physician: Dr. Bridgett Larsson  CC: Left thigh pain  History is obtained from: Patient, Chart review  HPI: Paula Jarvis is a 45 y.o. female with no significant past medical history who presented to the ED 12/21 for evaluation of 3-4 weeks of progressive left thigh pain since 06/23/2021. Patient states that on 11/26, she woke up in the morning and was unable to move her bilateral lower extremities. She states that they were both numb but that in about 2 hours the numbness of her right lower extremity resolved and she was able to move it. She states that her left lower extremity numbness resolved with some improvement in her mobility but with resolution came significant pain and weakness. She states the pain was mainly in her outer thigh and she had associated swelling. She states that she does sometimes have difficulty with swelling and pain of her bilateral lower extremities if she overexerts herself but this typically resolves with rest. She saw her PCP on 12/5 who ordered a [place] without evidence of thrombosis with unremarkable CK, ESR, CRP, B12, ANA, and RF and placed her on gabapentin. Patient had ongoing complaints of pain and was started on a Medrol Dose pack with discontinuation of gabapentin and patient endorsed some improvement in her left lower extremity pain. She also was started on Cymbalta but states that she has had blurry vision since starting this medication. Patient states that she has also recently developed a burning sensation in her bilateral feet starting on 12/13 and was seen by Dr. Billey Gosling at [facility] on 12/16 after a referral from her PCP and states that it was then that she realized she was having ongoing sensory deficits on the left lower extremity. MRI spinal imaging was obtained with plans for possible EMG if MRI imaging was unrevealing. She states that due to her pain and limited mobility, she has been out of  work since 11/30 but attempted to work on 12/19 and 12/20 but she was sent home due to her dragging her left leg with walking, her knee buckling with ambulation, and complaints of significant pain.   On Wednesday 12/21, she came back to the ED with left lower extremity pain and radiation to the left back and on Tuesday, 12/20 she developed pain in both legs, her right waist area, and had right arm pain. She states that she feels her right arm pain is due to her having to lay on her right arm to take the pressure off of her left hip and that the pain in her right leg is a different pain than she is experiencing on the left lower extremity and feels that this aching is due to having to drag her left leg with ambulation. She does endorse a history of sciatica. She denies any bowel or bladder dysfunction.   Her initial work up revealed that she is positive for COVID-19 but her MRI brain and spinal imaging are unrevealing for an etiology of patient's presentation. 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   ROS: A complete ROS was performed and is negative except as noted in the HPI.  Past Medical History:  Diagnosis Date   Thrombocytosis    Past Surgical History:  Procedure Laterality Date   TUBAL LIGATION     Family  History  Problem Relation Age of Onset   Lung cancer Father    Parkinson's disease Maternal Grandmother    Social History:   reports that she has never smoked. She has never used smokeless tobacco. She reports that she does not drink alcohol and does not use drugs.  Medications  Current Facility-Administered Medications:    acetaminophen (TYLENOL) tablet 650 mg, 650 mg, Oral, Q6H PRN **OR** acetaminophen (TYLENOL) suppository 650 mg, 650 mg, Rectal, Q6H PRN, Kristopher Oppenheim, DO   folic acid  (FOLVITE) tablet 1 mg, 1 mg, Oral, Daily, Allie Bossier, MD   ondansetron [facility]) tablet 4 mg, 4 mg, Oral, Q6H PRN **OR** ondansetron (ZOFRAN) injection 4 mg, 4 mg, Intravenous, Q6H PRN, Kristopher Oppenheim, DO   pregabalin (LYRICA) capsule 25 mg, 25 mg, Oral, BID, Kristopher Oppenheim, DO   remdesivir 200 mg in sodium chloride 0.9% 250 mL IVPB, 200 mg, Intravenous, Once **FOLLOWED BY** [START ON 07/20/2021] remdesivir 100 mg in sodium chloride 0.9 % 100 mL IVPB, 100 mg, Intravenous, Daily, Allie Bossier, MD   thiamine tablet 100 mg, 100 mg, Oral, Daily, Allie Bossier, MD  Current Outpatient Medications:    DULoxetine (CYMBALTA) 20 MG capsule, Take 1 capsule (20 mg total) by mouth daily., Disp: 30 capsule, Rfl: 1   methylPREDNISolone (MEDROL DOSEPAK) 4 MG TBPK tablet, Take as directed on pack (Patient not taking: Reported on 07/18/2021), Disp: 21 tablet, Rfl: 1  Exam: Current vital signs: BP 114/86    Pulse 65    Temp 98.6 F (37 C) (Oral)    Resp 18    Ht _0  (1.753 m)    Wt 95.3 kg    SpO2 100%    BMI 31.01 kg/m  Vital signs in last 24 hours: Pulse Rate:  [51-77] 65 (12/22 0800) Resp:  [16-20] 18 (12/22 0800) BP: (98-155)/(62-116) 114/86 (12/22 0800) SpO2:  [92 %-100 %] 100 % (12/22 0800)  GENERAL: Awake, alert, in no acute distress Psych: Affect appropriate for situation, patient is calm and cooperative with examination Head: Normocephalic and atraumatic, without obvious abnormality EENT: Normal conjunctivae, dry mucous membranes, no OP obstruction LUNGS: Normal respiratory effort. Non-labored breathing on room air, SpO2 100% on telemetry CV: Bradycardia on cardiac monitor, no pedal edema noted ABDOMEN: Soft, non-tender, non-distended Extremities: warm, well perfused, without obvious deformity  NEURO:  Mental Status: Awake, alert, and oriented to person, place, time, and situation. She is able to provide a clear and coherent history of present illness. Speech/Language: speech is intact  without dysarthria  Naming, repetition, fluency, and comprehension intact without aphasia  No neglect is noted Cranial Nerves:  II: PERRL 6 mm/brisk. Visual fields full.  III, IV, VI: EOMI without ptosis, nystagmus, or gaze preference V: Sensation is intact to light touch and symmetrical to face.  VII: Face is symmetric resting and smiling.  VIII: Hearing is intact to voice IX, X: Palate elevation is symmetric. Phonation normal.  XI: Normal sternocleidomastoid and trapezius muscle strength XII: Tongue protrudes midline without fasciculations.   Motor: Left upper extremity weakness is reported to be chronic from previous injury (patient is on a 15 pound weight limit restriction at work) with 4/5 grip strength, 4/5 triceps, 5/5 biceps. Elevates antigravity without vertical drift. Right upper extremity 5/5 strength throughout. Right lower extremity 5/5 strength.  Left lower extremity strength assessment is pain limited with 3/5 strength at the hip, 4/5 at the knee and ankle with plantar and dorsiflexion.  Sensation: Patient reports decreased sensation to  light touch and cool temperature to the left lower extremity throughout. She is unable to detect pin prick sensation to the left foot with decreased sensation to pin prick at the left ankle compared to the right. She does endorse decreased sensation to pin prick on the right foot. Patient is also unable to feel vibration on the left foot with decreased vibratory sense at the knee and ankle levels. Regarding her upper extremities, patient reports cooler temperature sensation and increased sensation to pin prick sensation on the left upper extremity compared to the right. Coordination: FTN intact bilaterally. HKS intact bilaterally. No pronator drift. Alternating hand movements.  DTRs: 3+ and symmetric patellae, 2+ and symmetric biceps and brachioradialis.  Gait: Deferred  Labs I have reviewed labs in epic and the results pertinent to this  consultation are: CBC    Component Value Date/Time   WBC 3.6 (L) 07/18/2021 0750   RBC 4.07 07/18/2021 0750   HGB 10.5 (L) 07/18/2021 0750   HCT 36.0 07/18/2021 0750   PLT 356 07/18/2021 0750   MCV 88.5 07/18/2021 0750   MCH 25.8 (L) 07/18/2021 0750   MCHC 29.2 (L) 07/18/2021 0750   RDW 17.6 (H) 07/18/2021 0750   LYMPHSABS 1.4 07/18/2021 0750   MONOABS 0.6 07/18/2021 0750   EOSABS 0.0 07/18/2021 0750   BASOSABS 0.0 07/18/2021 0750   CMP     Component Value Date/Time   NA 138 07/18/2021 0750   NA 139 07/09/2021 0831   K 4.1 07/18/2021 0750   CL 107 07/18/2021 0750   CO2 26 07/18/2021 0750   GLUCOSE 95 07/18/2021 0750   BUN 9 07/18/2021 0750   BUN 12 07/09/2021 0831   CREATININE 0.73 07/18/2021 0750   CALCIUM 8.6 (L) 07/18/2021 0750   PROT 7.2 07/09/2021 0831   ALBUMIN 4.5 07/09/2021 0831   AST 11 07/09/2021 0831   ALT 10 07/09/2021 0831   ALKPHOS 69 07/09/2021 0831   BILITOT <0.2 07/09/2021 0831   GFRNONAA >60 07/18/2021 0750   GFRAA >60 03/23/2020 1858   Lipid Panel  No results found for: CHOL, TRIG, HDL, CHOLHDL, VLDL, LDLCALC, LDLDIRECT  Lab Results  Component Value Date   HGBA1C 5.7 (H) 07/13/2021   Lab Results  Component Value Date   VITAMINB12 440 07/09/2021   Lab Results  Component Value Date   TSH 0.357 (L) 07/13/2021   Lab Results  Component Value Date   ESRSEDRATE 15 07/09/2021   Lab Results  Component Value Date   CRP <1 07/09/2021   Imaging I have reviewed the images obtained:  MRI examination of the brain wwo 12/21: Normal brain MRI. No acute intracranial abnormality or findings to explain patient's symptoms.  MR cervical spine 12/21: 1. Normal MRI appearance of the cervical spinal cord. No evidence for myelopathy. 2. Degenerative disc bulge with uncovertebral hypertrophy at C4-5 with resultant mild spinal stenosis, with moderate right worse than left C5 foraminal narrowing. 3. Right-sided uncovertebral spurring at C5-6 with  resultant mild to moderate right C6 foraminal stenosis. 4. Additional mild noncompressive disc bulging at C3-4 and C6-7 without stenosis or impingement.  MR thoracic spine 12/21: 1. Normal MRI appearance of the thoracic spinal cord. No evidence for myelopathy or other abnormality. 2. Mild for age degenerative spondylosis with small disc protrusions at T1-2 and T6-7 through T9-10 as above. No significant stenosis or neural impingement. 3. Trace layering bilateral pleural effusions. 4. Probable cholelithiasis.  MR lumbar spine 12/21: 1. Normal MRI of the conus medullaris and cauda  equina. No findings to explain patient's symptoms. 2. No significant disc pathology within the lumbar spine. No stenosis or neural impingement.  Assessment: 45 y.o. female who presents for evaluation of one month of progressive weakness and pain to the left lower extremity with radiation to the left lower back impairing her ambulation with intermittent involvement of the right lower extremity.  - Examination reveals patient with significant left thigh pain and tenderness to touch over the left thigh. She does have left lower extremity sensory deficits that cross in the upper extremities with sensory deficits on the right (though patient believes this is secondary to having to lay on her right arm constantly).  - Patient's weakness is felt to be 2/2 a pain limited assessment versus functional weakness and there is no evidence of right lower extremity weakness or sensory deficits on examination.  - Imaging reviewed, as above, without clear etiology of patient's presentation, however, on attending neurologist's evaluation, patient did have some variable reporting of symptoms and with a split vibratory sensation at midline on her forehead increasing the suspicion for functional etiology of patient's symptoms.   Recommendations: - No further inpatient neurology recommendations at this time - May follow up outpatient for EMG if  symptoms are persistent  Pt seen by NP/Neuro and later by MD. Note/plan to be edited by MD as needed.  Anibal Henderson, AGAC-NP [facility] Pager: [phone]  Have seen the patient and reviewed the above note.  On my examination, she has significant and consistency, sometimes able to plantar/dorsiflex sometimes not giving any effort at all.  With distraction, I am able to get her to do slightly more.  She splits midline to vibration on the forehead.  She has no drift in the left arm, which is inconsistent with her significant left arm weakness.  She has markedly reduced vibration in all five toes on the left, with intact vibration by the midfoot.  She has decreased sensation throughout the left leg including all dermatomes checked.  She has 2+ and symmetric reflexes.  No upgoing toe.  This is a difficult to localize exam, though it could conceivably be referred to the central lesion, this is not demonstrated on imaging and would not explain the inconsistency, or the midline splitting.  At this point, I suspect that her weakness has a nonorganic etiology.  It is possible that she has some underlying pathology such as bursitis with embellishment.   If she continues to have symptoms, could consider outpatient EMG to rule out underlying pathology with embellishment.  Neurology will be available on an as-needed basis, no further inpatient testing at this time, please call with further questions or concerns.  Roland Rack, MD [facility] [phone]  If 7pm- 7am, please page neurology on call as listed in [place].

## 2021-07-19 NOTE — Assessment & Plan Note (Signed)
Patient has pain over the greater trochanteric bursa.  It is exquisitely tender.  Patient does state that she did have swelling of this area and this did improve while he was on a Medrol Dosepak.  Patient did receive 1 dose of Decadron yesterday.  We will go ahead and repeat this again.  The pain in her left greater trochanteric bursa not explain why he would have right leg weakness.  SHe may have 2 different processes going on.

## 2021-07-19 NOTE — Evaluation (Signed)
Physical Therapy Evaluation Patient Details Name: Paula Jarvis MRN: [id number] DOB: [birth year] Today's Date: 07/19/2021  History of Present Illness  Paula Jarvis is a 45 yo female who presents with worsening pain in left leg since Thanksgiving. Pt is incidentally covid19 positive. MRI brian, cervical, thoracic, lumbar negative for cause of pain. PMH: thrombocytosis   Clinical Impression  Pt admitted with above diagnosis. Pt from home with spouse and children who have been assisting as needed since Thanksgiving due to progressive LLE pain and weakness. Pt currently requiring min guard-min A to mobilize in room, holding onto furniture to steady self and ambulate. Pt with LLE weakness 0-1/5 throughout, reports complete numbness in anterior/medial/lateral quad and decreased sensation throughout posterior quad and lower leg. Pt is very motivated to return home with spouse/family to assist as needed, wants to return to work; would benefit from CIR for aggressive rehab to regain independence prior to return home with family support. Pt currently with functional limitations due to the deficits listed below (see PT Problem List). Pt will benefit from skilled PT to increase their independence and safety with mobility to allow discharge to the venue listed below.          Recommendations for follow up therapy are one component of a multi-disciplinary discharge planning process, led by the attending physician.  Recommendations may be updated based on patient status, additional functional criteria and insurance authorization.  Follow Up Recommendations Acute inpatient rehab (3hours/day)    Assistance Recommended at Discharge Frequent or constant Supervision/Assistance  Functional Status Assessment Patient has had a recent decline in their functional status and demonstrates the ability to make significant improvements in function in a reasonable and predictable amount of time.  Equipment  Recommendations  Other (comment) (TBD)    Recommendations for Other Services OT consult     Precautions / Restrictions Precautions Precautions: Fall Precaution Comments: LLE weakness Restrictions Weight Bearing Restrictions: No      Mobility  Bed Mobility Overal bed mobility: Needs Assistance Bed Mobility: Supine to Sit;Sit to Supine  Supine to sit: Supervision Sit to supine: Min assist  General bed mobility comments: significantly increased time with BUE assisting LLE off of bed; min A to lift LLE back into bed due to fatigue and pain post ambulation    Transfers Overall transfer level: Needs assistance Equipment used: None Transfers: Sit to/from Stand Sit to Stand: Min guard  General transfer comment: close stand by for powering to stand, decreased LLE WB, maintains flexed bil knee posture without LOB    Ambulation/Gait Ambulation/Gait assistance: Min assist Gait Distance (Feet): 15 Feet Assistive device:  (hands on furniture) Gait Pattern/deviations: Step-to pattern Gait velocity: decreased  General Gait Details: pt ambulates around hospital bed in room using furniture to assist with steadying, slides LLE forward with trunk rotation assisting due to inability to clear L foot from floor, hops R foot forward while supporting self with furniture, no overt LOB but generally unsteady, fatigues easily  Stairs            Wheelchair Mobility    Modified Rankin (Stroke Patients Only)       Balance Overall balance assessment: Mild deficits observed, not formally tested       Pertinent Vitals/Pain Pain Assessment: Faces Faces Pain Scale: Hurts even more Pain Location: L hip Pain Descriptors / Indicators: Stabbing;Numbness Pain Intervention(s): Limited activity within patient's tolerance;Monitored during session;Repositioned    Home Living Family/patient expects to be discharged to:: Private residence Living Arrangements: Children Available Help at  Discharge: Family;Available 24 hours/day Type of Home: House Home Access: Stairs to enter Entrance Stairs-Rails: Right;Left;Can reach both Entrance Stairs-Number of Steps: 3 Alternate Level Stairs-Number of Steps: flight Home Layout: Multi-level (3 levels) Home Equipment: Shower seat;Grab bars - toilet;Grab bars - tub/shower Additional Comments: 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    Prior Function Prior Level of Function : Needs assist  Physical Assist : Mobility (physical);ADLs (physical) Mobility (physical): Bed mobility;Transfers;Gait;Stairs ADLs (physical): Bathing;Dressing;Toileting;IADLs Mobility Comments: Pt reports completely ind prior to Thanksgiving, now requiring assist with transfers and limited household ambulation ADLs Comments: Pt reports completely ind prior to Thanksgiving, now spouse assisting with ADLs/IADLs as needed     Hand Dominance        Extremity/Trunk Assessment        Lower Extremity Assessment Lower Extremity Assessment: RLE deficits/detail;LLE deficits/detail RLE Deficits / Details: AROM WNL RLE Sensation: WNL RLE Coordination: WNL LLE Deficits / Details: 1/5 big toe extension, 0/5 dorsiflexion, 1/5 plantarflexion, 1/5 knee flexion, 0/5 knee extension, 0/5 hip abd/add, 0/5 hip flexion LLE Sensation: decreased light touch (completely numb quad/medial/lateral thigh, decreased sensation posterior thigh) LLE Coordination: decreased fine motor;decreased gross motor       Communication   Communication: No difficulties  Cognition Arousal/Alertness: Awake/alert Behavior During Therapy: WFL for tasks assessed/performed Overall Cognitive Status: Within Functional Limits for tasks assessed     General Comments      Exercises     Assessment/Plan    PT Assessment Patient needs continued PT services  PT Problem List Decreased strength;Decreased range of motion;Decreased activity tolerance;Decreased balance;Decreased knowledge of  use of DME;Impaired sensation;Impaired tone;Pain       PT Treatment Interventions DME instruction;Gait training;Stair training;Functional mobility training;Therapeutic activities;Therapeutic exercise;Balance training;Neuromuscular re-education;Patient/family education    PT Goals (Current goals can be found in the Care Plan section)  Acute Rehab PT Goals Patient Stated Goal: "find out what is wrong with me" PT Goal Formulation: With patient Time For Goal Achievement: 08/02/21 Potential to Achieve Goals: Good    Frequency Min 3X/week   Barriers to discharge        Co-evaluation               AM-PAC PT "6 Clicks" Mobility  Outcome Measure Help needed turning from your back to your side while in a flat bed without using bedrails?: A Little Help needed moving from lying on your back to sitting on the side of a flat bed without using bedrails?: A Little Help needed moving to and from a bed to a chair (including a wheelchair)?: A Little Help needed standing up from a chair using your arms (e.g., wheelchair or bedside chair)?: A Little Help needed to walk in hospital room?: A Lot Help needed climbing 3-5 steps with a railing? : A Lot 6 Click Score: 16    End of Session   Activity Tolerance: Patient tolerated treatment well;Patient limited by fatigue;Patient limited by pain Patient left: in bed;with call bell/phone within reach Nurse Communication: Mobility status PT Visit Diagnosis: Other abnormalities of gait and mobility (R26.89);Muscle weakness (generalized) (M62.81);Difficulty in walking, not elsewhere classified (R26.2);Other symptoms and signs involving the nervous system (R67.893)    Time: 8101-7510 PT Time Calculation (min) (ACUTE ONLY): 43 min   Charges:   PT Evaluation $PT Eval Moderate Complexity: 1 Mod PT Treatments $Therapeutic Activity: 8-22 mins         Tori Kalecia Hartney PT, DPT 07/19/21, 11:56 AM

## 2021-07-19 NOTE — Evaluation (Signed)
Occupational Therapy Evaluation Patient Details Name: Paula Jarvis MRN: [id number] DOB: Jul 14, 1976 Today's Date: 07/19/2021   History of Present Illness Paula Jarvis is a 45 yo female who presents with worsening pain in left leg since Thanksgiving. Pt is incidentally covid19 positive. MRI brian, cervical, thoracic, lumbar negative for cause of pain. PMH: thrombocytosis   Clinical Impression   Patient evaluated by Occupational Therapy with no further acute OT needs identified. All education has been completed and the patient has no further questions.  See below for any follow-up Occupational Therapy or equipment needs. Bedside commode recommended. OT is signing off. Thank you for this referral.       Recommendations for follow up therapy are one component of a multi-disciplinary discharge planning process, led by the attending physician.  Recommendations may be updated based on patient status, additional functional criteria and insurance authorization.   Follow Up Recommendations  No OT follow up    Assistance Recommended at Discharge Intermittent Supervision/Assistance  Functional Status Assessment  Patient has had a recent decline in their functional status and demonstrates the ability to make significant improvements in function in a reasonable and predictable amount of time.  Equipment Recommendations  BSC/3in1    Recommendations for Other Services       Precautions / Restrictions Precautions Precautions: Fall Precaution Comments: LLE weakness Restrictions Weight Bearing Restrictions: No      Mobility Bed Mobility Overal bed mobility: Needs Assistance Bed Mobility: Supine to Sit;Sit to Supine     Supine to sit: Supervision Sit to supine: Supervision   General bed mobility comments: significantly increased time with pt's BUE assisting LLE off of bed; Pt able to lift each LE onto bed after ambulation with increased time/effort.    Transfers Overall transfer  level: Needs assistance Equipment used: Rolling walker (2 wheels) Transfers: Sit to/from Stand Sit to Stand: Supervision           General transfer comment: close stand by for powering to stand, decreased LLE WB, maintains flexed bil knee posture without LOB      Balance Overall balance assessment: Mild deficits observed, not formally tested                                         ADL either performed or assessed with clinical judgement   ADL Overall ADL's : Needs assistance/impaired Eating/Feeding: Independent   Grooming: Modified independent;Standing Grooming Details (indicate cue type and reason): Pt able to stand for grooming but more comfortable sitting with setup. Upper Body Bathing: Sitting;Modified independent   Lower Body Bathing: Sitting/lateral leans;Modified independent   Upper Body Dressing : Set up;Sitting   Lower Body Dressing: Moderate assistance;Sitting/lateral leans Lower Body Dressing Details (indicate cue type and reason): Pt able to don RT sock but need assist to don LT sock. Pt has help at home and denied need of adaptive equipment training. Toilet Transfer: Rolling walker (2 wheels);Grab bars;Ambulation;Regular Toilet;Modified Independent Toilet Transfer Details (indicate cue type and reason): Pt uses regular sock on LT foot in order to slide it along with ambulation using RW. Pt able to lower and rise from standard commode with mildly decreased eccentric control with descent. Pt educated on bedside commode, height and angle to assist with toilet tranfers and pt verbalized understanding. Toileting- Clothing Manipulation and Hygiene: Modified independent;Sitting/lateral lean     Tub/Shower Transfer Details (indicate cue type and reason): Pt reports having large  garden tub and shower chair. Pt reports she is able to sit on edge of tub safely and swing LEs into bath. Functional mobility during ADLs: Supervision/safety;Modified  independent;Rolling walker (2 wheels) General ADL Comments: Slow gait and easily fatigued.     Vision Baseline Vision/History: 0 No visual deficits Vision Assessment?: No apparent visual deficits     Perception Perception Perception: Within Functional Limits   Praxis Praxis Praxis: Intact    Pertinent Vitals/Pain Pain Assessment: 0-10 Pain Score: 5  Faces Pain Scale: Hurts even more Pain Location: L hip Pain Descriptors / Indicators: Stabbing;Numbness Pain Intervention(s): Limited activity within patient's tolerance;Repositioned     Hand Dominance Right   Extremity/Trunk Assessment Upper Extremity Assessment Upper Extremity Assessment: Overall WFL for tasks assessed   Lower Extremity Assessment Lower Extremity Assessment: Defer to PT evaluation   Cervical / Trunk Assessment Cervical / Trunk Assessment: Normal   Communication Communication Communication: No difficulties   Cognition Arousal/Alertness: Awake/alert Behavior During Therapy: WFL for tasks assessed/performed Overall Cognitive Status: Within Functional Limits for tasks assessed                                 General Comments: No cognitive deficits.     General Comments       Exercises     Shoulder Instructions      Home Living Family/patient expects to be discharged to:: Private residence Living Arrangements: Spouse/significant other;Children Available Help at Discharge: Family;Available 24 hours/day Type of Home: House Home Access: Stairs to enter [employer] of Steps: 3 Entrance Stairs-Rails: Right;Left;Can reach both Home Layout: Multi-level Alternate Level Stairs-Number of Steps: flight   Bathroom Shower/Tub: Tub/shower unit;Tub only   Biochemist, clinical: Standard     Home Equipment: Shower seat;Grab bars - toilet;Grab bars - tub/shower   Additional Comments: pt reports working FT in [employer] but hasn't for ~4 weeks      Prior Functioning/Environment Prior  Level of Function : Needs assist       Physical Assist : Mobility (physical);ADLs (physical) Mobility (physical): Bed mobility;Transfers;Gait;Stairs ADLs (physical): Bathing;Dressing;Toileting;IADLs Mobility Comments: Pt reports completely ind prior to Thanksgiving, now requiring assist with transfers and limited household ambulation ADLs Comments: Pt reports completely ind prior to Thanksgiving, now spouse assisting with ADLs/IADLs as needed        OT Problem List: Pain      OT Treatment/Interventions:      OT Goals(Current goals can be found in the care plan section) Acute Rehab OT Goals Patient Stated Goal: LT hip pain to resolve. OT Goal Formulation: All assessment and education complete, DC therapy Potential to Achieve Goals: Good  OT Frequency:     Barriers to D/C:            Co-evaluation              AM-PAC OT "6 Clicks" Daily Activity     Outcome Measure Help from another person eating meals?: None Help from another person taking care of personal grooming?: None Help from another person toileting, which includes using toliet, bedpan, or urinal?: None Help from another person bathing (including washing, rinsing, drying)?: A Little Help from another person to put on and taking off regular upper body clothing?: None Help from another person to put on and taking off regular lower body clothing?: A Lot 6 Click Score: 21   End of Session Equipment Utilized During Treatment: Rolling walker (2 wheels)  Activity Tolerance: Patient  tolerated treatment well Patient left: in bed;with call bell/phone within reach  OT Visit Diagnosis: Pain;Muscle weakness (generalized) (M62.81) Pain - Right/Left: Left Pain - part of body: Hip;Leg                Time: 1660-6004 OT Time Calculation (min): 22 min Charges:  OT General Charges $OT Visit: 1 Visit OT Evaluation $OT Eval Low Complexity: 1 Low  Joas Motton, OT Acute Rehab Services Office:  [phone] 07/19/2021  Julien Girt 07/19/2021, 2:06 PM

## 2021-07-19 NOTE — Progress Notes (Signed)
Inpatient Rehab Admissions Coordinator:   Per PT recommendation pt was screened for CIR appropriateness by Shann Medal, PT, DPT.  No clear diagnosis at this time, so unable to support medical necessity for CIR.  Will rescreen in another few days but at this time would recommend f/u with another rehab venue.    Shann Medal, PT, DPT Admissions Coordinator [phone] 07/19/21  12:38 PM

## 2021-07-19 NOTE — Subjective & Objective (Signed)
CC: worsening left leg pain, now with right leg pain HPI:  45 year old African-American female with no prior medical history presents with 3 weeks worth of worsening pain in the left leg.  Patient states that her initial symptoms started the Saturday after Thanksgiving.  She states that when she went to bed on Friday night, she was able to walk.  He states that she woke up on Saturday morning after Thanksgiving with pain in her left leg.  She was unable to move her left leg.  Took her several hours for her to be able to put her leg on the floor.  After that she was moving around significantly.  He went back to work on Tuesday.  She was having a limp on the left leg.  He went to urgent care on 5 December to establish care.  He was sent to the ER to have a ultrasound done of her leg.  Her ultrasound was negative.  She continued to have pain in her leg.  She was sent and seen by Dr. Billey Gosling with neurology.  Her work-up there was negative.  MRI of her spine was ordered.  Patient states that she began to have pain.  Her laboratory evaluation was unremarkable.  She presented to the ER yesterday due to worsening pain in her left leg and now with pain in her right leg.  She describes a burning sensation of her feet bilaterally.  Of note when she initially went to urgent care, she was having swelling over the left greater trochanteric area.  She was given a Medrol Dosepak which did help with the swelling.  He did state that when she was on Medrol, her pain was somewhat better.  Patient has been taking gabapentin but this was too strong for her.  She was started on Cymbalta which he did not notice any difference in her pain.  Patient complained of severe pain in ER.  SHe received 4 doses of IV Dilaudid along with 1 dose of Percocet for pain.  MRI of the brain, cervical, thoracic and lumbar spine did not reveal a cause for the patient's complaint of severe pain.  EDP is consulted neurology who recommended patient  be admitted to the hospital for in-house neurologic evaluation.  [facility] hospitalist contacted for admission.

## 2021-07-19 NOTE — Assessment & Plan Note (Signed)
Patient has not been vaccinated for COVID.  she was noted to be incidentally COVID-positive.  She is not hypoxic.

## 2021-07-19 NOTE — Progress Notes (Signed)
PROGRESS NOTE    Paula Jarvis  NTI:[id number] DOB: 11-07-1975 DOA: 07/18/2021 PCP: Lorrene Reid, PA-C   Brief Narrative:  45 year old BF PMHx none.  Presents with 3 weeks worth of worsening pain in the left leg.  Patient states that her initial symptoms started the Saturday after Thanksgiving.  She states that when she went to bed on Friday night, she was able to walk.  He states that she woke up on Saturday morning after Thanksgiving with pain in her left leg.  She was unable to move her left leg.  Took her several hours for her to be able to put her leg on the floor.  After that she was moving around significantly.  He went back to work on Tuesday.  She was having a limp on the left leg.   He went to urgent care on 5 December to establish care.  He was sent to the ER to have a ultrasound done of her leg.  Her ultrasound was negative.  She continued to have pain in her leg.  She was sent and seen by Dr. Billey Gosling with neurology.  Her work-up there was negative.  MRI of her spine was ordered.  Patient states that she began to have pain.  Her laboratory evaluation was unremarkable.  She presented to the ER yesterday due to worsening pain in her left leg and now with pain in her right leg.  She describes a burning sensation of her feet bilaterally.   Of note when she initially went to urgent care, she was having swelling over the left greater trochanteric area.  She was given a Medrol Dosepak which did help with the swelling.  He did state that when she was on Medrol, her pain was somewhat better.   Patient has been taking gabapentin but this was too strong for her.  She was started on Cymbalta which he did not notice any difference in her pain.   Patient complained of severe pain in ER.  SHe received 4 doses of IV Dilaudid along with 1 dose of Percocet for pain.   MRI of the brain, cervical, thoracic and lumbar spine did not reveal a cause for the patient's complaint of severe pain.   EDP is  consulted neurology who recommended patient be admitted to the hospital for in-house neurologic evaluation.   [facility] hospitalist contacted for admission.    Subjective: A/O x4 patient admitted this morning no charge   Assessment & Plan:  Covid vaccination; unvaccinated  Principal Problem:   Weakness of both lower extremities Active Problems:   COVID-19 virus infection   Greater trochanteric bursitis, left   Weakness of both legs   BMI 31.0-31.9,adult  Weakness of both lower extremities Assigned observation status.  MRI of brain, cervical, thoracic and lumbar spine did not reveal a cause for why the patient would have bilateral lower extremity weakness along with right arm weakness.  Patient thinks that her right arm weakness may have been due to her sleeping on her right side now because the left side is still sore.  Awaiting in-house neurologic consultation.  Patient may need outpatient EMG study.  Patient has no prior history of multiple sclerosis.  There is no family history of MS.  MRI of the brain was negative for any demyelinating disease.  Defer to neurology if the patient would benefit from lumbar puncture.   COVID-19 virus infection Patient has not been vaccinated for COVID.  she was noted to be incidentally COVID-positive.  She is not  hypoxic. COVID-19 Labs  Recent Labs    07/19/21 1113  DDIMER 0.37  LDH 165    Lab Results  Component Value Date   SARSCOV2NAA POSITIVE (A) 07/18/2021   [place] NEGATIVE 09/23/2020   [place] NEGATIVE 03/23/2020   SARSCOV2NAA NOT DETECTED 01/14/2019  -12/22 Paxlovid per pharmacy protocol   Greater trochanteric bursitis, left Patient has pain over the greater trochanteric bursa.  It is exquisitely tender.  Patient does state that she did have swelling of this area and this did improve while he was on a Medrol Dosepak.  Patient did receive 1 dose of Decadron yesterday.  We will go ahead and repeat this again.  The pain in her left  greater trochanteric bursa not explain why he would have right leg weakness.  SHe may have 2 different processes going on.  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      Consultants:  Neurology  Procedures/Significant Events:     I have personally reviewed and interpreted all radiology studies and my findings are as above.  VENTILATOR SETTINGS:    Cultures   Antimicrobials: Anti-infectives (From admission, onward)    Start     Ordered Stop   07/20/21 1000  remdesivir 100 mg in sodium chloride 0.9 % 100 mL IVPB  Status:  Discontinued       See Hyperspace for full Linked Orders Report.   07/19/21 0827 07/19/21 1406   07/19/21 1700  nirmatrelvir/ritonavir EUA (PAXLOVID) 3 tablet        07/19/21 1519 07/24/21 2159   07/19/21 0830  remdesivir 200 mg in sodium chloride 0.9% 250 mL IVPB  Status:  Discontinued       See Hyperspace for full Linked Orders Report.   07/19/21 0827 07/19/21 1406         Devices    LINES / TUBES:      Continuous Infusions:     Objective: Vitals:   07/19/21 0800 07/19/21 1100 07/19/21 1241 07/19/21 1416  BP: 114/86 128/67 123/60 134/61  Pulse: 65 64 65 64  Resp: 18 18 16 18   Temp:   97.7 F (36.5 C) 98 F (36.7 C)  TempSrc:   Oral Oral  SpO2: 100% 98% 100% 99%  Weight:      Height:       No intake or output data in the 24 hours ending 07/19/21 1419 Filed Weights   07/18/21 0438  Weight: 95.3 kg    Examination:  Patient seen today at admission by [facility] physician no charge .     Data Reviewed: Care during the described time interval was provided by me .  I have reviewed this patient's available data, including medical history, events of  note, physical examination, and all test results as part of my evaluation.   CBC: Recent Labs  Lab 07/18/21 0750  WBC 3.6*  NEUTROABS 1.6*  HGB 10.5*  HCT 36.0  MCV 88.5  PLT 967   Basic Metabolic Panel: Recent Labs  Lab 07/18/21 0750  NA 138  K 4.1  CL 107  CO2 26  GLUCOSE 95  BUN 9  CREATININE 0.73  CALCIUM 8.6*   GFR: Estimated Creatinine Clearance: 111.4 mL/min (by C-G formula based on SCr of 0.73 mg/dL). Liver Function Tests: No results for input(s): AST, ALT, ALKPHOS, BILITOT, PROT, ALBUMIN in the last 168 hours. No results for input(s): LIPASE, AMYLASE in the last 168 hours. No results for input(s): AMMONIA in the last 168 hours. Coagulation Profile: No results for input(s): INR, PROTIME in the last 168 hours. Cardiac Enzymes: Recent Labs  Lab 07/18/21 0750  CKTOTAL 59   BNP (last 3 results) No results for input(s): PROBNP in the last 8760 hours. HbA1C: No results for input(s): HGBA1C in the last 72 hours. CBG: No results for input(s): GLUCAP in the last 168 hours. Lipid Profile: No results for input(s): CHOL, HDL, LDLCALC, TRIG, CHOLHDL, LDLDIRECT in the last 72 hours. Thyroid Function Tests: No results for input(s): TSH, T4TOTAL, FREET4, T3FREE, THYROIDAB in the last 72 hours. Anemia Panel: No results for input(s): VITAMINB12, FOLATE, FERRITIN, TIBC, IRON, RETICCTPCT in the last 72 hours. Urine analysis:    Component Value Date/Time   COLORURINE YELLOW 09/23/2020 1748   APPEARANCEUR CLEAR 09/23/2020 1748   LABSPEC 1.019 09/23/2020 1748   PHURINE 6.0 09/23/2020 1748   GLUCOSEU NEGATIVE 09/23/2020 [place] NEGATIVE 09/23/2020 [place] 09/23/2020 1748   KETONESUR NEGATIVE 09/23/2020 1748   PROTEINUR NEGATIVE 09/23/2020 1748   NITRITE NEGATIVE 09/23/2020 1748   LEUKOCYTESUR NEGATIVE 09/23/2020 1748   Sepsis Labs: @LABRCNTIP (procalcitonin:4,lacticidven:4)  ) Recent Results (from the past 240 hour(s))  Resp Panel by  RT-PCR (Flu A&B, Covid) Nasopharyngeal Swab     Status: Abnormal   Collection Time: 07/18/21  7:28 AM   Specimen: Nasopharyngeal Swab; Nasopharyngeal(NP) swabs in vial transport medium  Result Value Ref Range Status   SARS Coronavirus 2 by RT PCR POSITIVE (A) NEGATIVE Final    Comment: (NOTE) SARS-CoV-2 target nucleic acids are DETECTED.  The SARS-CoV-2 RNA is generally detectable in upper respiratory specimens during the acute phase of infection. Positive results are indicative of the presence of the identified virus, but do not rule out bacterial infection or co-infection with other pathogens not detected by the test. Clinical correlation with patient history and other diagnostic information is necessary to determine patient infection status. The expected result is Negative.  Fact Sheet for Patients: EntrepreneurPulse.com.au  Fact Sheet for Healthcare Providers: IncredibleEmployment.be  This test is not yet approved or cleared by the [place] FDA and  has been authorized for detection and/or diagnosis of SARS-CoV-2 by FDA under an Emergency Use Authorization (EUA).  This EUA will remain in effect (meaning this test can be used) for the duration of  the COVID-19 declaration under Section 564(b)(1) of the A ct, 21 U.S.C. section 360bbb-3(b)(1), unless the authorization is terminated or revoked sooner.     Influenza A by PCR NEGATIVE NEGATIVE Final   Influenza B by PCR NEGATIVE NEGATIVE Final    Comment: (NOTE) The Xpert Xpress SARS-CoV-2/FLU/RSV plus assay is intended as an aid in the diagnosis of influenza from Nasopharyngeal swab specimens and should not be used as a sole basis for treatment. Nasal washings and aspirates are unacceptable for Xpert Xpress SARS-CoV-2/FLU/RSV testing.  Fact Sheet for Patients: EntrepreneurPulse.com.au  Fact Sheet for Healthcare  Providers: IncredibleEmployment.be  This test is not yet approved or cleared by the [place] FDA and has been authorized for detection and/or diagnosis  of SARS-CoV-2 by FDA under an Emergency Use Authorization (EUA). This EUA will remain in effect (meaning this test can be used) for the duration of the COVID-19 declaration under Section 564(b)(1) of the Act, 21 U.S.C. section 360bbb-3(b)(1), unless the authorization is terminated or revoked.  Performed at [facility], [place] [address], [place], [place] [address]          Radiology Studies: MR Brain W and Wo Contrast  Result Date: 07/19/2021 CLINICAL DATA:  Initial evaluation for myelopathy, lower extremity weakness over past 3 weeks, new right upper extremity arm pain. EXAM: MRI HEAD WITHOUT AND WITH CONTRAST TECHNIQUE: Multiplanar, multiecho pulse sequences of the brain and surrounding structures were obtained without and with intravenous contrast. CONTRAST:  64mL GADAVIST GADOBUTROL 1 MMOL/ML IV SOLN COMPARISON:  None. FINDINGS: Brain: Cerebral volume within normal limits for patient age. Single punctate focus of FLAIR hyperintensity noted within the periventricular white matter of the anterior left frontal lobe (series 12, image 29), nonspecific, but of doubtful significance in isolation. No other focal parenchymal signal abnormality. No abnormal foci of restricted diffusion to suggest acute or subacute ischemia. Gray-white matter differentiation well maintained. No encephalomalacia to suggest chronic infarction. No foci of susceptibility artifact to suggest acute or chronic intracranial hemorrhage. No mass lesion, midline shift or mass effect. No hydrocephalus. No extra-axial fluid collection. Major dural sinuses are grossly patent. Pituitary gland and suprasellar region are normal. Midline structures intact and normal. No abnormal enhancement. Vascular: Major intracranial vascular flow voids well  maintained and normal in appearance. Skull and upper cervical spine: Craniocervical junction normal. Visualized upper cervical spine within normal limits. Bone marrow signal intensity diffusely decreased on T1 weighted sequence, nonspecific, but most commonly related to anemia, smoking or obesity. 1.4 cm T2 hypointense nodular lesion at the left parietal scalp (series 9, image 24), nonspecific, but likely benign. Scalp soft tissues otherwise unremarkable. Sinuses/Orbits: Globes and orbital soft tissues within normal limits. Mild scattered mucosal thickening noted within the ethmoidal air cells and maxillary sinuses. Paranasal sinuses are otherwise clear. No mastoid effusion. Inner ear structures grossly normal. Other: None. IMPRESSION: Normal brain MRI. No acute intracranial abnormality or findings to explain patient's symptoms. Electronically Signed   By: Jeannine Boga M.D.   On: 07/19/2021 01:11   MR Cervical Spine W or Wo Contrast  Result Date: 07/19/2021 CLINICAL DATA:  Initial evaluation for chronic neck pain, myelopathy, lower extremity weakness over past 3 weeks. EXAM: MRI CERVICAL SPINE WITHOUT AND WITH CONTRAST TECHNIQUE: Multiplanar and multiecho pulse sequences of the cervical spine, to include the craniocervical junction and cervicothoracic junction, were obtained without and with intravenous contrast. CONTRAST:  70mL GADAVIST GADOBUTROL 1 MMOL/ML IV SOLN COMPARISON:  None available. FINDINGS: Alignment: Straightening of the normal cervical lordosis. No listhesis. Vertebrae: Vertebral body height maintained without acute or chronic fracture. Bone marrow signal intensity diffusely decreased on T1 weighted sequence, nonspecific, but most commonly related to anemia, smoking or obesity. No discrete or worrisome osseous lesions. No abnormal marrow edema or enhancement. Cord: Normal signal morphology.  No abnormal enhancement. Posterior Fossa, vertebral arteries, paraspinal tissues: Visualized  brain and posterior fossa within normal limits. Craniocervical junction normal. Paraspinous and prevertebral soft tissues within normal limits. Normal intravascular flow voids seen within the vertebral arteries bilaterally. Disc levels: C2-C3: Mild endplate spurring without significant disc bulge. No canal or foraminal stenosis. C3-C4: Mild disc bulge with endplate and uncovertebral spurring. Flattening of the ventral thecal sac without significant spinal stenosis or cord deformity. Foramina  remain patent. C4-C5: Diffuse disc bulge with bilateral uncovertebral hypertrophy, worse on the right. Flattening and partial effacement of the ventral thecal sac with resultant mild spinal stenosis. Minimal flattening of the ventral cord without cord signal changes. Moderate right worse than left C5 foraminal stenosis. C5-C6: Mild disc bulge with right-sided uncovertebral spurring. No spinal stenosis. Mild to moderate right C6 foraminal narrowing. Left neural foramen remains patent. C6-C7:  Minimal disc bulge.  No canal or foraminal stenosis. C7-T1:  Unremarkable. T1-2: Small central disc protrusion without stenosis or cord deformity. Foramina remain patent. IMPRESSION: 1. Normal MRI appearance of the cervical spinal cord. No evidence for myelopathy. 2. Degenerative disc bulge with uncovertebral hypertrophy at C4-5 with resultant mild spinal stenosis, with moderate right worse than left C5 foraminal narrowing. 3. Right-sided uncovertebral spurring at C5-6 with resultant mild to moderate right C6 foraminal stenosis. 4. Additional mild noncompressive disc bulging at C3-4 and C6-7 without stenosis or impingement. Electronically Signed   By: Jeannine Boga M.D.   On: 07/19/2021 01:19   MR THORACIC SPINE W WO CONTRAST  Result Date: 07/19/2021 CLINICAL DATA:  Initial evaluation for myelopathy, lower extremity weakness over past 3 weeks. EXAM: MRI THORACIC WITHOUT AND WITH CONTRAST TECHNIQUE: Multiplanar and multiecho pulse  sequences of the thoracic spine were obtained without and with intravenous contrast. CONTRAST:  58mL GADAVIST GADOBUTROL 1 MMOL/ML IV SOLN COMPARISON:  None available. FINDINGS: Alignment: Trace dextroscoliosis. Alignment otherwise normal with preservation of the normal thoracic kyphosis. No listhesis. Vertebrae: Vertebral body height maintained without acute or chronic fracture. Bone marrow signal intensity diffusely decreased on T1 weighted sequence, nonspecific, but most commonly related to anemia, smoking or obesity. No worrisome osseous lesions. No abnormal marrow edema or enhancement. Cord:  Normal signal morphology.  No abnormal enhancement. Paraspinal and other soft tissues: Paraspinous soft tissues demonstrate no acute finding. Trace layering bilateral pleural effusions noted. 7 mm simple cyst present at the hepatic dome. Probable cholelithiasis noted (series 20, image 39). Disc levels: T1-2: Tiny central/right paracentral disc protrusion minimally indents the ventral thecal sac. No significant spinal stenosis or cord deformity. Foramina remain patent. T2-3: Unremarkable. T3-4: Unremarkable. T4-5: Unremarkable. T5-6: Unremarkable. T6-7: Shallow left paracentral disc protrusion mildly flattens the left ventral thecal sac. No significant spinal stenosis or cord deformity. Foramina remain patent. T7-8: Shallow left paracentral disc protrusion mildly flattens the left ventral thecal sac. No significant spinal stenosis or cord deformity. Foramina remain patent. T8-9: Shallow left paracentral disc protrusion mildly indents the ventral thecal sac. No significant spinal stenosis or cord deformity. Foramina remain patent. T9-10: Small left paracentral to foraminal disc protrusion mildly flattens the left ventral thecal sac. No significant spinal stenosis or cord deformity. Foramina remain patent. T10-11: Normal interspace.  Mild facet hypertrophy.  No stenosis. T11-12: Normal interspace. Mild left-sided facet  hypertrophy. No stenosis. T12-L1: Unremarkable. IMPRESSION: 1. Normal MRI appearance of the thoracic spinal cord. No evidence for myelopathy or other abnormality. 2. Mild for age degenerative spondylosis with small disc protrusions at T1-2 and T6-7 through T9-10 as above. No significant stenosis or neural impingement. 3. Trace layering bilateral pleural effusions. 4. Probable cholelithiasis. Electronically Signed   By: Jeannine Boga M.D.   On: 07/19/2021 01:31   MR Lumbar Spine W Wo Contrast  Result Date: 07/19/2021 CLINICAL DATA:  Initial evaluation for myelopathy, lower extremity weakness over past 3 weeks. EXAM: MRI LUMBAR SPINE WITHOUT AND WITH CONTRAST TECHNIQUE: Multiplanar and multiecho pulse sequences of the lumbar spine were obtained without and with intravenous  contrast. CONTRAST:  55mL GADAVIST GADOBUTROL 1 MMOL/ML IV SOLN COMPARISON:  None available. FINDINGS: Segmentation: Standard. Lowest well-formed disc space labeled the L5-S1 level. Alignment: Mild levoscoliosis. Alignment otherwise normal preservation of the normal lumbar lordosis. No significant listhesis. Vertebrae: Vertebral body height maintained without acute or chronic fracture. Bone marrow signal intensity diffusely decreased on T1 weighted sequence, nonspecific, but most commonly related to anemia, smoking or obesity. Small benign hemangioma noted within the L2 vertebral body. No worrisome osseous lesions. No abnormal marrow edema or enhancement. Conus medullaris and cauda equina: Conus extends to the L1 level. Conus and cauda equina appear normal. No abnormal enhancement. Paraspinal and other soft tissues: Paraspinous soft tissues within normal limits. Few subcentimeter benign appearing renal cysts noted bilaterally. Visualized visceral structures otherwise unremarkable. Disc levels: No significant disc pathology seen within the lumbar spine. No disc bulge or focal disc herniation. No significant facet pathology. No canal or  neural foraminal stenosis or evidence for neural impingement. IMPRESSION: 1. Normal MRI of the conus medullaris and cauda equina. No findings to explain patient's symptoms. 2. No significant disc pathology within the lumbar spine. No stenosis or neural impingement. Electronically Signed   By: Jeannine Boga M.D.   On: 07/19/2021 01:39        Scheduled Meds:  folic acid  1 mg Oral Daily   pregabalin  25 mg Oral BID   thiamine  100 mg Oral Daily   Continuous Infusions:     LOS: 0 days   The patient is critically ill with multiple organ systems failure and requires high complexity decision making for assessment and support, frequent evaluation and titration of therapies, application of advanced monitoring technologies and extensive interpretation of multiple databases. Critical Care Time devoted to patient care services described in this note  Time spent: 40 minutes     Jaleil Renwick, Geraldo Docker, MD [facility]   If 7PM-7AM, please contact night-coverage 07/19/2021, 2:19 PM

## 2021-07-19 NOTE — H&P (Signed)
History and Physical    Paula Jarvis KTG:[id number] DOB: 01-15-1976 DOA: 07/18/2021  PCP: Lorrene Reid, PA-C   Patient coming from: Home  I have personally briefly reviewed patient's old medical records in [place]  CC: worsening left leg pain, now with right leg pain HPI:  45 year old African-American female with no prior medical history presents with 3 weeks worth of worsening pain in the left leg.  Patient states that her initial symptoms started the Saturday after Thanksgiving.  She states that when she went to bed on Friday night, she was able to walk.  He states that she woke up on Saturday morning after Thanksgiving with pain in her left leg.  She was unable to move her left leg.  Took her several hours for her to be able to put her leg on the floor.  After that she was moving around significantly.  He went back to work on Tuesday.  She was having a limp on the left leg.  He went to urgent care on 5 December to establish care.  He was sent to the ER to have a ultrasound done of her leg.  Her ultrasound was negative.  She continued to have pain in her leg.  She was sent and seen by Dr. Billey Gosling with neurology.  Her work-up there was negative.  MRI of her spine was ordered.  Patient states that she began to have pain.  Her laboratory evaluation was unremarkable.  She presented to the ER yesterday due to worsening pain in her left leg and now with pain in her right leg.  She describes a burning sensation of her feet bilaterally.  Of note when she initially went to urgent care, she was having swelling over the left greater trochanteric area.  She was given a Medrol Dosepak which did help with the swelling.  He did state that when she was on Medrol, her pain was somewhat better.  Patient has been taking gabapentin but this was too strong for her.  She was started on Cymbalta which he did not notice any difference in her pain.  Patient complained of severe pain in ER.  SHe received  4 doses of IV Dilaudid along with 1 dose of Percocet for pain.  MRI of the brain, cervical, thoracic and lumbar spine did not reveal a cause for the patient's complaint of severe pain.  EDP is consulted neurology who recommended patient be admitted to the hospital for in-house neurologic evaluation.  [facility] contacted for admission.   ED Course: MRI brain, cervical, thoracic, lumbar spine negative for cause of her pain.  Lab work looks unremarkable.  Patient knows that she has been admitted.  Review of Systems:  Review of Systems  Constitutional:  Positive for malaise/fatigue.  HENT: Negative.    Eyes: Negative.   Respiratory: Negative.    Cardiovascular: Negative.   Gastrointestinal: Negative.   Genitourinary: Negative.   Musculoskeletal: Negative.   Skin: Negative.   Neurological:  Positive for weakness.       Complains of a burning sensation on the bottom of both feet.  The numbness in her left lower leg.  Endo/Heme/Allergies: Negative.   Psychiatric/Behavioral: Negative.    All other systems reviewed and are negative.  Past Medical History:  Diagnosis Date   Thrombocytosis     Past Surgical History:  Procedure Laterality Date   TUBAL LIGATION       reports that she has never smoked. She has never used smokeless tobacco. She reports  that she does not drink alcohol and does not use drugs.  No Known Allergies  Family History  Problem Relation Age of Onset   Lung cancer Father    Parkinson's disease Maternal Grandmother     Prior to Admission medications   Medication Sig Start Date End Date Taking? Authorizing Provider  DULoxetine (CYMBALTA) 20 MG capsule Take 1 capsule (20 mg total) by mouth daily. 07/10/21  Yes Lorrene Reid, PA-C  methylPREDNISolone (MEDROL DOSEPAK) 4 MG TBPK tablet Take as directed on pack Patient not taking: Reported on 07/18/2021 07/05/21   Lorrene Reid, PA-C    Physical Exam: Vitals:   07/18/21 2200 07/19/21 0105 07/19/21  0304 07/19/21 0400  BP: 123/75 (!) 155/91 (!) 150/80 116/67  Pulse: (!) 55 63 65 (!) 52  Resp: 16 17 18 18   Temp:      TempSrc:      SpO2: 94% 98% 99% 100%  Weight:      Height:        Physical Exam Vitals and nursing note reviewed.  Constitutional:      General: She is not in acute distress.    Appearance: Normal appearance. She is obese. She is not ill-appearing, toxic-appearing or diaphoretic.  HENT:     Head: Normocephalic and atraumatic.     Nose: Nose normal. No rhinorrhea.  Eyes:     General:        Right eye: No discharge.        Left eye: No discharge.  Cardiovascular:     Rate and Rhythm: Normal rate and regular rhythm.     Pulses: Normal pulses.  Pulmonary:     Effort: Pulmonary effort is normal. No respiratory distress.     Breath sounds: Normal breath sounds. No wheezing or rales.  Abdominal:     General: Bowel sounds are normal. There is no distension.     Tenderness: There is no abdominal tenderness. There is no guarding.  Musculoskeletal:     Comments: Pain to palpation over the left greater trochanteric bursa.  Skin:    General: Skin is warm and dry.     Capillary Refill: Capillary refill takes less than 2 seconds.  Neurological:     Mental Status: She is alert and oriented to person, place, and time.     Comments: Normal right straight leg raise test.  Patient unable to lift her left leg against gravity.  With assistance, patient complains of pain in her back with left straight leg raise test.  Patient able to dorsi and plantarflex feet bilaterally.     Labs on Admission: I have personally reviewed following labs and imaging studies  CBC: Recent Labs  Lab 07/18/21 0750  WBC 3.6*  NEUTROABS 1.6*  HGB 10.5*  HCT 36.0  MCV 88.5  PLT 315   Basic Metabolic Panel: Recent Labs  Lab 07/18/21 0750  NA 138  K 4.1  CL 107  CO2 26  GLUCOSE 95  BUN 9  CREATININE 0.73  CALCIUM 8.6*   GFR: Estimated Creatinine Clearance: 111.4 mL/min (by C-G  formula based on SCr of 0.73 mg/dL). Liver Function Tests: No results for input(s): AST, ALT, ALKPHOS, BILITOT, PROT, ALBUMIN in the last 168 hours. No results for input(s): LIPASE, AMYLASE in the last 168 hours. No results for input(s): AMMONIA in the last 168 hours. Coagulation Profile: No results for input(s): INR, PROTIME in the last 168 hours. Cardiac Enzymes: Recent Labs  Lab 07/18/21 0750  CKTOTAL 59   BNP (  last 3 results) No results for input(s): PROBNP in the last 8760 hours. HbA1C: No results for input(s): HGBA1C in the last 72 hours. CBG: No results for input(s): GLUCAP in the last 168 hours. Lipid Profile: No results for input(s): CHOL, HDL, LDLCALC, TRIG, CHOLHDL, LDLDIRECT in the last 72 hours. Thyroid Function Tests: No results for input(s): TSH, T4TOTAL, FREET4, T3FREE, THYROIDAB in the last 72 hours. Anemia Panel: No results for input(s): VITAMINB12, FOLATE, FERRITIN, TIBC, IRON, RETICCTPCT in the last 72 hours. Urine analysis:    Component Value Date/Time   COLORURINE YELLOW 09/23/2020 1748   APPEARANCEUR CLEAR 09/23/2020 1748   LABSPEC 1.019 09/23/2020 1748   PHURINE 6.0 09/23/2020 [place] 09/23/2020 [place] NEGATIVE 09/23/2020 [place] 09/23/2020 [place] 09/23/2020 1748   PROTEINUR NEGATIVE 09/23/2020 1748   NITRITE NEGATIVE 09/23/2020 1748   LEUKOCYTESUR NEGATIVE 09/23/2020 1748    Radiological Exams on Admission: I have personally reviewed images MR Brain W and Wo Contrast  Result Date: 07/19/2021 CLINICAL DATA:  Initial evaluation for myelopathy, lower extremity weakness over past 3 weeks, new right upper extremity arm pain. EXAM: MRI HEAD WITHOUT AND WITH CONTRAST TECHNIQUE: Multiplanar, multiecho pulse sequences of the brain and surrounding structures were obtained without and with intravenous contrast. CONTRAST:  22mL GADAVIST GADOBUTROL 1 MMOL/ML IV SOLN COMPARISON:  None. FINDINGS:  Brain: Cerebral volume within normal limits for patient age. Single punctate focus of FLAIR hyperintensity noted within the periventricular white matter of the anterior left frontal lobe (series 12, image 29), nonspecific, but of doubtful significance in isolation. No other focal parenchymal signal abnormality. No abnormal foci of restricted diffusion to suggest acute or subacute ischemia. Gray-white matter differentiation well maintained. No encephalomalacia to suggest chronic infarction. No foci of susceptibility artifact to suggest acute or chronic intracranial hemorrhage. No mass lesion, midline shift or mass effect. No hydrocephalus. No extra-axial fluid collection. Major dural sinuses are grossly patent. Pituitary gland and suprasellar region are normal. Midline structures intact and normal. No abnormal enhancement. Vascular: Major intracranial vascular flow voids well maintained and normal in appearance. Skull and upper cervical spine: Craniocervical junction normal. Visualized upper cervical spine within normal limits. Bone marrow signal intensity diffusely decreased on T1 weighted sequence, nonspecific, but most commonly related to anemia, smoking or obesity. 1.4 cm T2 hypointense nodular lesion at the left parietal scalp (series 9, image 24), nonspecific, but likely benign. Scalp soft tissues otherwise unremarkable. Sinuses/Orbits: Globes and orbital soft tissues within normal limits. Mild scattered mucosal thickening noted within the ethmoidal air cells and maxillary sinuses. Paranasal sinuses are otherwise clear. No mastoid effusion. Inner ear structures grossly normal. Other: None. IMPRESSION: Normal brain MRI. No acute intracranial abnormality or findings to explain patient's symptoms. Electronically Signed   By: Jeannine Boga M.D.   On: 07/19/2021 01:11   MR Cervical Spine W or Wo Contrast  Result Date: 07/19/2021 CLINICAL DATA:  Initial evaluation for chronic neck pain, myelopathy, lower  extremity weakness over past 3 weeks. EXAM: MRI CERVICAL SPINE WITHOUT AND WITH CONTRAST TECHNIQUE: Multiplanar and multiecho pulse sequences of the cervical spine, to include the craniocervical junction and cervicothoracic junction, were obtained without and with intravenous contrast. CONTRAST:  53mL GADAVIST GADOBUTROL 1 MMOL/ML IV SOLN COMPARISON:  None available. FINDINGS: Alignment: Straightening of the normal cervical lordosis. No listhesis. Vertebrae: Vertebral body height maintained without acute or chronic fracture. Bone marrow signal intensity diffusely decreased on T1 weighted sequence, nonspecific, but  most commonly related to anemia, smoking or obesity. No discrete or worrisome osseous lesions. No abnormal marrow edema or enhancement. Cord: Normal signal morphology.  No abnormal enhancement. Posterior Fossa, vertebral arteries, paraspinal tissues: Visualized brain and posterior fossa within normal limits. Craniocervical junction normal. Paraspinous and prevertebral soft tissues within normal limits. Normal intravascular flow voids seen within the vertebral arteries bilaterally. Disc levels: C2-C3: Mild endplate spurring without significant disc bulge. No canal or foraminal stenosis. C3-C4: Mild disc bulge with endplate and uncovertebral spurring. Flattening of the ventral thecal sac without significant spinal stenosis or cord deformity. Foramina remain patent. C4-C5: Diffuse disc bulge with bilateral uncovertebral hypertrophy, worse on the right. Flattening and partial effacement of the ventral thecal sac with resultant mild spinal stenosis. Minimal flattening of the ventral cord without cord signal changes. Moderate right worse than left C5 foraminal stenosis. C5-C6: Mild disc bulge with right-sided uncovertebral spurring. No spinal stenosis. Mild to moderate right C6 foraminal narrowing. Left neural foramen remains patent. C6-C7:  Minimal disc bulge.  No canal or foraminal stenosis. C7-T1:   Unremarkable. T1-2: Small central disc protrusion without stenosis or cord deformity. Foramina remain patent. IMPRESSION: 1. Normal MRI appearance of the cervical spinal cord. No evidence for myelopathy. 2. Degenerative disc bulge with uncovertebral hypertrophy at C4-5 with resultant mild spinal stenosis, with moderate right worse than left C5 foraminal narrowing. 3. Right-sided uncovertebral spurring at C5-6 with resultant mild to moderate right C6 foraminal stenosis. 4. Additional mild noncompressive disc bulging at C3-4 and C6-7 without stenosis or impingement. Electronically Signed   By: Jeannine Boga M.D.   On: 07/19/2021 01:19   MR THORACIC SPINE W WO CONTRAST  Result Date: 07/19/2021 CLINICAL DATA:  Initial evaluation for myelopathy, lower extremity weakness over past 3 weeks. EXAM: MRI THORACIC WITHOUT AND WITH CONTRAST TECHNIQUE: Multiplanar and multiecho pulse sequences of the thoracic spine were obtained without and with intravenous contrast. CONTRAST:  5mL GADAVIST GADOBUTROL 1 MMOL/ML IV SOLN COMPARISON:  None available. FINDINGS: Alignment: Trace dextroscoliosis. Alignment otherwise normal with preservation of the normal thoracic kyphosis. No listhesis. Vertebrae: Vertebral body height maintained without acute or chronic fracture. Bone marrow signal intensity diffusely decreased on T1 weighted sequence, nonspecific, but most commonly related to anemia, smoking or obesity. No worrisome osseous lesions. No abnormal marrow edema or enhancement. Cord:  Normal signal morphology.  No abnormal enhancement. Paraspinal and other soft tissues: Paraspinous soft tissues demonstrate no acute finding. Trace layering bilateral pleural effusions noted. 7 mm simple cyst present at the hepatic dome. Probable cholelithiasis noted (series 20, image 39). Disc levels: T1-2: Tiny central/right paracentral disc protrusion minimally indents the ventral thecal sac. No significant spinal stenosis or cord deformity.  Foramina remain patent. T2-3: Unremarkable. T3-4: Unremarkable. T4-5: Unremarkable. T5-6: Unremarkable. T6-7: Shallow left paracentral disc protrusion mildly flattens the left ventral thecal sac. No significant spinal stenosis or cord deformity. Foramina remain patent. T7-8: Shallow left paracentral disc protrusion mildly flattens the left ventral thecal sac. No significant spinal stenosis or cord deformity. Foramina remain patent. T8-9: Shallow left paracentral disc protrusion mildly indents the ventral thecal sac. No significant spinal stenosis or cord deformity. Foramina remain patent. T9-10: Small left paracentral to foraminal disc protrusion mildly flattens the left ventral thecal sac. No significant spinal stenosis or cord deformity. Foramina remain patent. T10-11: Normal interspace.  Mild facet hypertrophy.  No stenosis. T11-12: Normal interspace. Mild left-sided facet hypertrophy. No stenosis. T12-L1: Unremarkable. IMPRESSION: 1. Normal MRI appearance of the thoracic spinal cord. No evidence for  myelopathy or other abnormality. 2. Mild for age degenerative spondylosis with small disc protrusions at T1-2 and T6-7 through T9-10 as above. No significant stenosis or neural impingement. 3. Trace layering bilateral pleural effusions. 4. Probable cholelithiasis. Electronically Signed   By: Jeannine Boga M.D.   On: 07/19/2021 01:31   MR Lumbar Spine W Wo Contrast  Result Date: 07/19/2021 CLINICAL DATA:  Initial evaluation for myelopathy, lower extremity weakness over past 3 weeks. EXAM: MRI LUMBAR SPINE WITHOUT AND WITH CONTRAST TECHNIQUE: Multiplanar and multiecho pulse sequences of the lumbar spine were obtained without and with intravenous contrast. CONTRAST:  34mL GADAVIST GADOBUTROL 1 MMOL/ML IV SOLN COMPARISON:  None available. FINDINGS: Segmentation: Standard. Lowest well-formed disc space labeled the L5-S1 level. Alignment: Mild levoscoliosis. Alignment otherwise normal preservation of the normal  lumbar lordosis. No significant listhesis. Vertebrae: Vertebral body height maintained without acute or chronic fracture. Bone marrow signal intensity diffusely decreased on T1 weighted sequence, nonspecific, but most commonly related to anemia, smoking or obesity. Small benign hemangioma noted within the L2 vertebral body. No worrisome osseous lesions. No abnormal marrow edema or enhancement. Conus medullaris and cauda equina: Conus extends to the L1 level. Conus and cauda equina appear normal. No abnormal enhancement. Paraspinal and other soft tissues: Paraspinous soft tissues within normal limits. Few subcentimeter benign appearing renal cysts noted bilaterally. Visualized visceral structures otherwise unremarkable. Disc levels: No significant disc pathology seen within the lumbar spine. No disc bulge or focal disc herniation. No significant facet pathology. No canal or neural foraminal stenosis or evidence for neural impingement. IMPRESSION: 1. Normal MRI of the conus medullaris and cauda equina. No findings to explain patient's symptoms. 2. No significant disc pathology within the lumbar spine. No stenosis or neural impingement. Electronically Signed   By: Jeannine Boga M.D.   On: 07/19/2021 01:39    EKG: I have personally reviewed EKG: no EKG  Assessment/Plan Principal Problem:   Weakness of both lower extremities Active Problems:   COVID-19 virus infection   Greater trochanteric bursitis, left    Weakness of both lower extremities Assigned observation status.  MRI of brain, cervical, thoracic and lumbar spine did not reveal a cause for why the patient would have bilateral lower extremity weakness along with right arm weakness.  Patient thinks that her right arm weakness may have been due to her sleeping on her right side now because the left side is still sore.  Awaiting in-house neurologic consultation.  Patient may need outpatient EMG study.  Patient has no prior history of multiple  sclerosis.  There is no family history of MS.  MRI of the brain was negative for any demyelinating disease.  Defer to neurology if the patient would benefit from lumbar puncture.  COVID-19 virus infection Patient has not been vaccinated for COVID.  she was noted to be incidentally COVID-positive.  She is not hypoxic.  Greater trochanteric bursitis, left Patient has pain over the greater trochanteric bursa.  It is exquisitely tender.  Patient does state that she did have swelling of this area and this did improve while he was on a Medrol Dosepak.  Patient did receive 1 dose of Decadron yesterday.  We will go ahead and repeat this again.  The pain in her left greater trochanteric bursa not explain why he would have right leg weakness.  SHe may have 2 different processes going on.  DVT prophylaxis: SCDs Code Status: Full Code Family Communication: discussed with pt and husband at bedside  Disposition Plan: return home  Consults called: EDP has consulted neurology  Admission status: Observation, Med-Surg   Kristopher Oppenheim, DO [facility] 07/19/2021, 4:07 AM

## 2021-07-19 NOTE — Discharge Instructions (Signed)

## 2021-07-20 DIAGNOSIS — Z6831 Body mass index (BMI) 31.0-31.9, adult: Secondary | ICD-10-CM

## 2021-07-20 DIAGNOSIS — R29898 Other symptoms and signs involving the musculoskeletal system: Secondary | ICD-10-CM | POA: Diagnosis not present

## 2021-07-20 DIAGNOSIS — M7062 Trochanteric bursitis, left hip: Secondary | ICD-10-CM | POA: Diagnosis not present

## 2021-07-20 DIAGNOSIS — U071 COVID-19: Secondary | ICD-10-CM | POA: Diagnosis not present

## 2021-07-20 LAB — CBC WITH DIFFERENTIAL/PLATELET
Abs Immature Granulocytes: 0.01 10*3/uL (ref 0.00–0.07)
Basophils Absolute: 0 10*3/uL (ref 0.0–0.1)
Basophils Relative: 0 %
Eosinophils Absolute: 0.1 10*3/uL (ref 0.0–0.5)
Eosinophils Relative: 2 %
HCT: 34.3 % — ABNORMAL LOW (ref 36.0–46.0)
Hemoglobin: 10.5 g/dL — ABNORMAL LOW (ref 12.0–15.0)
Immature Granulocytes: 0 %
Lymphocytes Relative: 40 %
Lymphs Abs: 2.3 10*3/uL (ref 0.7–4.0)
MCH: 25.4 pg — ABNORMAL LOW (ref 26.0–34.0)
MCHC: 30.6 g/dL (ref 30.0–36.0)
MCV: 83.1 fL (ref 80.0–100.0)
Monocytes Absolute: 0.5 10*3/uL (ref 0.1–1.0)
Monocytes Relative: 8 %
Neutro Abs: 2.8 10*3/uL (ref 1.7–7.7)
Neutrophils Relative %: 50 %
Platelets: 364 10*3/uL (ref 150–400)
RBC: 4.13 MIL/uL (ref 3.87–5.11)
RDW: 16.7 % — ABNORMAL HIGH (ref 11.5–15.5)
WBC: 5.7 10*3/uL (ref 4.0–10.5)
nRBC: 0 % (ref 0.0–0.2)

## 2021-07-20 LAB — COMPREHENSIVE METABOLIC PANEL
ALT: 13 U/L (ref 0–44)
AST: 13 U/L — ABNORMAL LOW (ref 15–41)
Albumin: 3.6 g/dL (ref 3.5–5.0)
Alkaline Phosphatase: 43 U/L (ref 38–126)
Anion gap: 7 (ref 5–15)
BUN: 18 mg/dL (ref 6–20)
CO2: 24 mmol/L (ref 22–32)
Calcium: 8.9 mg/dL (ref 8.9–10.3)
Chloride: 106 mmol/L (ref 98–111)
Creatinine, Ser: 0.65 mg/dL (ref 0.44–1.00)
GFR, Estimated: >60 mL/min (ref >60)
Glucose, Bld: 102 mg/dL — ABNORMAL HIGH (ref 70–99)
Potassium: 3.4 mmol/L — ABNORMAL LOW (ref 3.5–5.1)
Sodium: 137 mmol/L (ref 135–145)
Total Bilirubin: 0.4 mg/dL (ref 0.3–1.2)
Total Protein: 7.1 g/dL (ref 6.5–8.1)

## 2021-07-20 LAB — FERRITIN: Ferritin: 5 ng/mL — ABNORMAL LOW (ref 11–307)

## 2021-07-20 LAB — C-REACTIVE PROTEIN: CRP: 0.6 mg/dL (ref <1.0)

## 2021-07-20 LAB — LACTATE DEHYDROGENASE: LDH: 137 U/L (ref 98–192)

## 2021-07-20 LAB — MAGNESIUM: Magnesium: 2.1 mg/dL (ref 1.7–2.4)

## 2021-07-20 LAB — PHOSPHORUS: Phosphorus: 3.7 mg/dL (ref 2.5–4.6)

## 2021-07-20 LAB — D-DIMER, QUANTITATIVE: D-Dimer, Quant: <0.27 ug/mL-FEU (ref 0.00–0.50)

## 2021-07-20 MED ORDER — FOLIC ACID 1 MG PO TABS: 1.0000 mg | ORAL_TABLET | Freq: Every day | ORAL | 0 refills | Status: AC

## 2021-07-20 MED ORDER — THIAMINE HCL 100 MG PO TABS: 100.0000 mg | ORAL_TABLET | Freq: Every day | ORAL | 0 refills | Status: AC

## 2021-07-20 MED ORDER — PREGABALIN 25 MG PO CAPS
25.0000 mg | ORAL_CAPSULE | Freq: Two times a day (BID) | ORAL | 0 refills | Status: AC
Start: 2021-07-20 — End: ?

## 2021-07-20 MED ORDER — POTASSIUM CHLORIDE CRYS ER 20 MEQ PO TBCR
50.0000 meq | EXTENDED_RELEASE_TABLET | Freq: Once | ORAL | Status: AC
  Administered 2021-07-20: 11:00:00 50 meq via ORAL
  Filled 2021-07-20: qty 1

## 2021-07-20 NOTE — Progress Notes (Signed)
Transition of Care [facility]) Screening Note  Patient Details  Name: Paula Jarvis Date of Birth: 1976-02-25  Transition of Care [facility]) CM/SW Contact:    Sherie Don, LCSW Phone Number: 07/20/2021, 2:28 PM  Transition of Care Department [facility]) has reviewed patient and no TOC needs have been identified at this time. We will continue to monitor patient advancement through interdisciplinary progression rounds. If new patient transition needs arise, please place a TOC consult.

## 2021-07-20 NOTE — Plan of Care (Signed)
Plan of care reviewed and discussed with the patient. 

## 2021-07-20 NOTE — Discharge Summary (Signed)
Physician Discharge Summary  Paula Jarvis EGB:[id number] DOB: 04/20/1976 DOA: 07/18/2021  PCP: Lorrene Reid, PA-C  Admit date: 07/18/2021 Discharge date: 07/20/2021  Time spent: 35 minutes  Recommendations for Outpatient Follow-up:   Weakness of both lower extremities -Per neurology believes nonorganic in nature - Neurology believes weakness possibly secondary to bursitis with embellishment. - 12/23 psychiatry consulted evaluate for psychogenic cause. - 12/23 per psychiatry note: Complete psychiatric specialty exam, not completed in its entirety as patient was baffled about psych consult, and denied any concerns. -Patient admits to not really using LEFT leg in the last month secondary to pain.  Greater trochanteric bursitis, left -Patient has pain over the greater trochanteric bursa.  It is exquisitely tender.  Patient does state that she did have swelling of this area and this did improve while he was on a Medrol Dosepak. -12/22 Received 1 dose of Decadron.   -Establish care with Dr. Rod Can [facility] on 27 December@1030  patient with suspected significant LEFT greater trochanteric bursitis evaluate and provide steroid injection if warranted for relief of symptoms   COVID-19 virus infection Patient has not been vaccinated for COVID.  she was noted to be incidentally COVID-positive.  She is not hypoxic. 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  Presents with 3 weeks worth of worsening pain in the left leg.  Patient states that her initial symptoms started the Saturday after Thanksgiving.  She states that when she went to bed on Friday night, she was able to walk.  He states that she woke up on Saturday morning after Thanksgiving with pain in her left leg.  She was unable to move her left leg.  Took her several hours for her to be able to put her leg on the floor.  After that she was moving around significantly.  He went back to work on Tuesday.  She was having a limp on the left leg.   He went to urgent care on 5 December to establish care.  He was sent to the ER to have a ultrasound done of her leg.  Her ultrasound was negative.  She continued to have pain in her leg.  She was sent and seen by Dr. Billey Gosling with neurology.  Her work-up there was negative.  MRI of her spine was ordered.  Patient states that she began to have pain.  Her laboratory evaluation was unremarkable.  She presented to the ER yesterday due to worsening  pain in her left leg and now with pain in her right leg.  She describes a burning sensation of her feet bilaterally.   Of note when she initially went to urgent care, she was having swelling over the left greater trochanteric area.  She was given a Medrol Dosepak which did help with the swelling.  He did state that when she was on Medrol, her pain was somewhat better.   Patient has been taking gabapentin but this was too strong for her.  She was started on Cymbalta which he did not notice any  difference in her pain.   Patient complained of severe pain in ER.  SHe received 4 doses of IV Dilaudid along with 1 dose of Percocet for pain.   MRI of the brain, cervical, thoracic and lumbar spine did not reveal a cause for the patient's complaint of severe pain.   EDP is consulted neurology who recommended patient be admitted to the hospital for in-house neurologic evaluation.  Hospital Course:  See above   Consultations: Neurology  Cultures  12/21 SARS coronavirus positive  Antibiotics Anti-infectives (From admission, onward)    Start     Ordered Stop   07/20/21 1000  remdesivir 100 mg in sodium chloride 0.9 % 100 mL IVPB  Status:  Discontinued       See Hyperspace for full Linked Orders Report.   07/19/21 0827 07/19/21 1406   07/19/21 1700  nirmatrelvir/ritonavir EUA (PAXLOVID) 3 tablet        07/19/21 1519 07/24/21 2159   07/19/21 0830  remdesivir 200 mg in sodium chloride 0.9% 250 mL IVPB  Status:  Discontinued       See Hyperspace for full Linked Orders Report.   07/19/21 0827 07/19/21 1406         Discharge Exam: Vitals:   07/19/21 1416 07/19/21 2152 07/20/21 0519 07/20/21 0624  BP: 134/61 (!) 119/55 130/80   Pulse: 64 (!) 58 67   Resp: 18 16 16    Temp: 98 F (36.7 C) 98 F (36.7 C) 98.1 F (36.7 C)   TempSrc: Oral Oral Oral   SpO2: 99% 99% 100%   Weight:    95 kg  Height:        General: She is not in acute distress Cardiovascular:  Rate and Rhythm: Normal rate and regular rhythm Respiratory: No respiratory distress. Breath sounds: Normal breath sounds. No wheezing or rales Musculoskeletal: Pain to palpation over the left greater trochanteric bursa   Discharge Instructions   Allergies as of 07/20/2021   No Known Allergies      Medication List     STOP taking these medications    methylPREDNISolone 4 MG Tbpk tablet Commonly known as: MEDROL DOSEPAK       TAKE these medications    DULoxetine 20 MG capsule Commonly known as:  Cymbalta Take 1 capsule (20 mg total) by mouth daily.   folic acid 1 MG tablet Commonly known as: FOLVITE Take 1 tablet (1 mg total) by mouth daily. Start taking on: July 21, 2021   pregabalin 25 MG capsule Commonly known as: LYRICA Take 1 capsule (25 mg total) by mouth 2 (two) times daily.   thiamine 100 MG tablet Take 1 tablet (100 mg total) by mouth daily. Start taking on: July 21, 2021               Durable Medical Equipment  (From admission, onward)           Start  Ordered   07/20/21 1617  For home use only DME Walker rolling  Once       Question Answer Comment  Walker: With [place]   Patient needs a walker to treat with the following condition Weakness of left lower extremity      07/20/21 1616           No Known Allergies  Follow-up [place], [facility]. Schedule an appointment as soon as possible for a visit.   Specialty: Behavioral Health Why: therapy, If symptoms worsen Contact information: [address] [place] [address] [phone]         Rod Can, MD Follow up on 07/24/2021.   Specialty: Orthopedic Surgery Why: Patient with suspected significant LEFT greater trochanteric bursitis evaluate and provide steroid injection if warranted for relief of symptoms Contact information: [address] [place] [address] [phone]                  The results of significant diagnostics from this hospitalization (including imaging, microbiology, ancillary and laboratory) are listed below for reference.    Significant Diagnostic Studies: MR Brain W and Wo Contrast  Result Date: 07/19/2021 CLINICAL DATA:  Initial evaluation for myelopathy, lower extremity weakness over past 3 weeks, new right upper extremity arm pain. EXAM: MRI HEAD WITHOUT AND WITH CONTRAST TECHNIQUE: Multiplanar, multiecho pulse sequences of the brain and surrounding  structures were obtained without and with intravenous contrast. CONTRAST:  63mL GADAVIST GADOBUTROL 1 MMOL/ML IV SOLN COMPARISON:  None. FINDINGS: Brain: Cerebral volume within normal limits for patient age. Single punctate focus of FLAIR hyperintensity noted within the periventricular white matter of the anterior left frontal lobe (series 12, image 29), nonspecific, but of doubtful significance in isolation. No other focal parenchymal signal abnormality. No abnormal foci of restricted diffusion to suggest acute or subacute ischemia. Gray-white matter differentiation well maintained. No encephalomalacia to suggest chronic infarction. No foci of susceptibility artifact to suggest acute or chronic intracranial hemorrhage. No mass lesion, midline shift or mass effect. No hydrocephalus. No extra-axial fluid collection. Major dural sinuses are grossly patent. Pituitary gland and suprasellar region are normal. Midline structures intact and normal. No abnormal enhancement. Vascular: Major intracranial vascular flow voids well maintained and normal in appearance. Skull and upper cervical spine: Craniocervical junction normal. Visualized upper cervical spine within normal limits. Bone marrow signal intensity diffusely decreased on T1 weighted sequence, nonspecific, but most commonly related to anemia, smoking or obesity. 1.4 cm T2 hypointense nodular lesion at the left parietal scalp (series 9, image 24), nonspecific, but likely benign. Scalp soft tissues otherwise unremarkable. Sinuses/Orbits: Globes and orbital soft tissues within normal limits. Mild scattered mucosal thickening noted within the ethmoidal air cells and maxillary sinuses. Paranasal sinuses are otherwise clear. No mastoid effusion. Inner ear structures grossly normal. Other: None. IMPRESSION: Normal brain MRI. No acute intracranial abnormality or findings to explain patient's symptoms. Electronically Signed   By: Jeannine Boga M.D.   On: 07/19/2021  01:11   MR Cervical Spine W or Wo Contrast  Result Date: 07/19/2021 CLINICAL DATA:  Initial evaluation for chronic neck pain, myelopathy, lower extremity weakness over past 3 weeks. EXAM: MRI CERVICAL SPINE WITHOUT AND WITH CONTRAST TECHNIQUE: Multiplanar and multiecho pulse sequences of the cervical spine, to include the craniocervical junction and cervicothoracic junction, were obtained without and with intravenous contrast. CONTRAST:  32mL GADAVIST GADOBUTROL 1 MMOL/ML IV SOLN COMPARISON:  None available.  FINDINGS: Alignment: Straightening of the normal cervical lordosis. No listhesis. Vertebrae: Vertebral body height maintained without acute or chronic fracture. Bone marrow signal intensity diffusely decreased on T1 weighted sequence, nonspecific, but most commonly related to anemia, smoking or obesity. No discrete or worrisome osseous lesions. No abnormal marrow edema or enhancement. Cord: Normal signal morphology.  No abnormal enhancement. Posterior Fossa, vertebral arteries, paraspinal tissues: Visualized brain and posterior fossa within normal limits. Craniocervical junction normal. Paraspinous and prevertebral soft tissues within normal limits. Normal intravascular flow voids seen within the vertebral arteries bilaterally. Disc levels: C2-C3: Mild endplate spurring without significant disc bulge. No canal or foraminal stenosis. C3-C4: Mild disc bulge with endplate and uncovertebral spurring. Flattening of the ventral thecal sac without significant spinal stenosis or cord deformity. Foramina remain patent. C4-C5: Diffuse disc bulge with bilateral uncovertebral hypertrophy, worse on the right. Flattening and partial effacement of the ventral thecal sac with resultant mild spinal stenosis. Minimal flattening of the ventral cord without cord signal changes. Moderate right worse than left C5 foraminal stenosis. C5-C6: Mild disc bulge with right-sided uncovertebral spurring. No spinal stenosis. Mild to  moderate right C6 foraminal narrowing. Left neural foramen remains patent. C6-C7:  Minimal disc bulge.  No canal or foraminal stenosis. C7-T1:  Unremarkable. T1-2: Small central disc protrusion without stenosis or cord deformity. Foramina remain patent. IMPRESSION: 1. Normal MRI appearance of the cervical spinal cord. No evidence for myelopathy. 2. Degenerative disc bulge with uncovertebral hypertrophy at C4-5 with resultant mild spinal stenosis, with moderate right worse than left C5 foraminal narrowing. 3. Right-sided uncovertebral spurring at C5-6 with resultant mild to moderate right C6 foraminal stenosis. 4. Additional mild noncompressive disc bulging at C3-4 and C6-7 without stenosis or impingement. Electronically Signed   By: Jeannine Boga M.D.   On: 07/19/2021 01:19   MR THORACIC SPINE W WO CONTRAST  Result Date: 07/19/2021 CLINICAL DATA:  Initial evaluation for myelopathy, lower extremity weakness over past 3 weeks. EXAM: MRI THORACIC WITHOUT AND WITH CONTRAST TECHNIQUE: Multiplanar and multiecho pulse sequences of the thoracic spine were obtained without and with intravenous contrast. CONTRAST:  11mL GADAVIST GADOBUTROL 1 MMOL/ML IV SOLN COMPARISON:  None available. FINDINGS: Alignment: Trace dextroscoliosis. Alignment otherwise normal with preservation of the normal thoracic kyphosis. No listhesis. Vertebrae: Vertebral body height maintained without acute or chronic fracture. Bone marrow signal intensity diffusely decreased on T1 weighted sequence, nonspecific, but most commonly related to anemia, smoking or obesity. No worrisome osseous lesions. No abnormal marrow edema or enhancement. Cord:  Normal signal morphology.  No abnormal enhancement. Paraspinal and other soft tissues: Paraspinous soft tissues demonstrate no acute finding. Trace layering bilateral pleural effusions noted. 7 mm simple cyst present at the hepatic dome. Probable cholelithiasis noted (series 20, image 39). Disc levels:  T1-2: Tiny central/right paracentral disc protrusion minimally indents the ventral thecal sac. No significant spinal stenosis or cord deformity. Foramina remain patent. T2-3: Unremarkable. T3-4: Unremarkable. T4-5: Unremarkable. T5-6: Unremarkable. T6-7: Shallow left paracentral disc protrusion mildly flattens the left ventral thecal sac. No significant spinal stenosis or cord deformity. Foramina remain patent. T7-8: Shallow left paracentral disc protrusion mildly flattens the left ventral thecal sac. No significant spinal stenosis or cord deformity. Foramina remain patent. T8-9: Shallow left paracentral disc protrusion mildly indents the ventral thecal sac. No significant spinal stenosis or cord deformity. Foramina remain patent. T9-10: Small left paracentral to foraminal disc protrusion mildly flattens the left ventral thecal sac. No significant spinal stenosis or cord deformity. Foramina remain patent. T10-11: Normal  interspace.  Mild facet hypertrophy.  No stenosis. T11-12: Normal interspace. Mild left-sided facet hypertrophy. No stenosis. T12-L1: Unremarkable. IMPRESSION: 1. Normal MRI appearance of the thoracic spinal cord. No evidence for myelopathy or other abnormality. 2. Mild for age degenerative spondylosis with small disc protrusions at T1-2 and T6-7 through T9-10 as above. No significant stenosis or neural impingement. 3. Trace layering bilateral pleural effusions. 4. Probable cholelithiasis. Electronically Signed   By: Jeannine Boga M.D.   On: 07/19/2021 01:31   MR Lumbar Spine W Wo Contrast  Result Date: 07/19/2021 CLINICAL DATA:  Initial evaluation for myelopathy, lower extremity weakness over past 3 weeks. EXAM: MRI LUMBAR SPINE WITHOUT AND WITH CONTRAST TECHNIQUE: Multiplanar and multiecho pulse sequences of the lumbar spine were obtained without and with intravenous contrast. CONTRAST:  8mL GADAVIST GADOBUTROL 1 MMOL/ML IV SOLN COMPARISON:  None available. FINDINGS: Segmentation:  Standard. Lowest well-formed disc space labeled the L5-S1 level. Alignment: Mild levoscoliosis. Alignment otherwise normal preservation of the normal lumbar lordosis. No significant listhesis. Vertebrae: Vertebral body height maintained without acute or chronic fracture. Bone marrow signal intensity diffusely decreased on T1 weighted sequence, nonspecific, but most commonly related to anemia, smoking or obesity. Small benign hemangioma noted within the L2 vertebral body. No worrisome osseous lesions. No abnormal marrow edema or enhancement. Conus medullaris and cauda equina: Conus extends to the L1 level. Conus and cauda equina appear normal. No abnormal enhancement. Paraspinal and other soft tissues: Paraspinous soft tissues within normal limits. Few subcentimeter benign appearing renal cysts noted bilaterally. Visualized visceral structures otherwise unremarkable. Disc levels: No significant disc pathology seen within the lumbar spine. No disc bulge or focal disc herniation. No significant facet pathology. No canal or neural foraminal stenosis or evidence for neural impingement. IMPRESSION: 1. Normal MRI of the conus medullaris and cauda equina. No findings to explain patient's symptoms. 2. No significant disc pathology within the lumbar spine. No stenosis or neural impingement. Electronically Signed   By: Jeannine Boga M.D.   On: 07/19/2021 01:39   VAS [place] LOWER EXTREMITY VENOUS (DVT)  Result Date: 07/05/2021  Lower Venous DVT Study Patient Name:  Paula Jarvis  Date of Exam:   07/05/2021 Medical Rec #: [id number]        Accession #:    [id number] Date of Birth: 1975/12/28       Patient Gender: F Patient Age:   33 years Exam Location:  [facility] Procedure:      VAS [place] LOWER EXTREMITY VENOUS (DVT) Referring Phys: Lorrene Reid --------------------------------------------------------------------------------  Indications: Pain, and Swelling.  Risk Factors: None identified. Limitations: Poor  ultrasound/tissue interface. Comparison Study: No prior studies. Performing Technologist: Oliver Hum RVT  Examination Guidelines: A complete evaluation includes B-mode imaging, spectral Doppler, color Doppler, and power Doppler as needed of all accessible portions of each vessel. Bilateral testing is considered an integral part of a complete examination. Limited examinations for reoccurring indications may be performed as noted. The reflux portion of the exam is performed with the patient in reverse Trendelenburg.  +-----+---------------+---------+-----------+----------+--------------+  RIGHT Compressibility Phasicity Spontaneity Properties Thrombus Aging  +-----+---------------+---------+-----------+----------+--------------+  CFV   Full            Yes       Yes                                    +-----+---------------+---------+-----------+----------+--------------+   +---------+---------------+---------+-----------+----------+--------------+  LEFT  Compressibility Phasicity Spontaneity Properties Thrombus Aging  +---------+---------------+---------+-----------+----------+--------------+  CFV       Full            Yes       Yes                                    +---------+---------------+---------+-----------+----------+--------------+  SFJ       Full                                                             +---------+---------------+---------+-----------+----------+--------------+  FV Prox   Full                                                             +---------+---------------+---------+-----------+----------+--------------+  FV Mid    Full                                                             +---------+---------------+---------+-----------+----------+--------------+  FV Distal Full                                                             +---------+---------------+---------+-----------+----------+--------------+  PFV       Full                                                              +---------+---------------+---------+-----------+----------+--------------+  POP       Full            Yes       Yes                                    +---------+---------------+---------+-----------+----------+--------------+  PTV       Full                                                             +---------+---------------+---------+-----------+----------+--------------+  PERO      Full                                                             +---------+---------------+---------+-----------+----------+--------------+  Summary: RIGHT: - No evidence of common femoral vein obstruction.  LEFT: - There is no evidence of deep vein thrombosis in the lower extremity.  - No cystic structure found in the popliteal fossa.  *See table(s) above for measurements and observations. Electronically signed by Orlie Pollen on 07/05/2021 at 5:14:23 PM.    Final     Microbiology: Recent Results (from the past 240 hour(s))  Resp Panel by RT-PCR (Flu A&B, Covid) Nasopharyngeal Swab     Status: Abnormal   Collection Time: 07/18/21  7:28 AM   Specimen: Nasopharyngeal Swab; Nasopharyngeal(NP) swabs in vial transport medium  Result Value Ref Range Status   SARS Coronavirus 2 by RT PCR POSITIVE (A) NEGATIVE Final    Comment: (NOTE) SARS-CoV-2 target nucleic acids are DETECTED.  The SARS-CoV-2 RNA is generally detectable in upper respiratory specimens during the acute phase of infection. Positive results are indicative of the presence of the identified virus, but do not rule out bacterial infection or co-infection with other pathogens not detected by the test. Clinical correlation with patient history and other diagnostic information is necessary to determine patient infection status. The expected result is Negative.  Fact Sheet for Patients: EntrepreneurPulse.com.au  Fact Sheet for Healthcare Providers: IncredibleEmployment.be  This test is not yet approved or cleared by  the [place] FDA and  has been authorized for detection and/or diagnosis of SARS-CoV-2 by FDA under an Emergency Use Authorization (EUA).  This EUA will remain in effect (meaning this test can be used) for the duration of  the COVID-19 declaration under Section 564(b)(1) of the A ct, 21 U.S.C. section 360bbb-3(b)(1), unless the authorization is terminated or revoked sooner.     Influenza A by PCR NEGATIVE NEGATIVE Final   Influenza B by PCR NEGATIVE NEGATIVE Final    Comment: (NOTE) The Xpert Xpress SARS-CoV-2/FLU/RSV plus assay is intended as an aid in the diagnosis of influenza from Nasopharyngeal swab specimens and should not be used as a sole basis for treatment. Nasal washings and aspirates are unacceptable for Xpert Xpress SARS-CoV-2/FLU/RSV testing.  Fact Sheet for Patients: EntrepreneurPulse.com.au  Fact Sheet for Healthcare Providers: IncredibleEmployment.be  This test is not yet approved or cleared by the [place] FDA and has been authorized for detection and/or diagnosis of SARS-CoV-2 by FDA under an Emergency Use Authorization (EUA). This EUA will remain in effect (meaning this test can be used) for the duration of the COVID-19 declaration under Section 564(b)(1) of the Act, 21 U.S.C. section 360bbb-3(b)(1), unless the authorization is terminated or revoked.  Performed at [facility], [place] [address] [place], [place] [address]   Culture, blood (Routine X 2) w Reflex to ID Panel     Status: None (Preliminary result)   Collection Time: 07/19/21 11:00 AM   Specimen: Right Antecubital; Blood  Result Value Ref Range Status   Specimen Description   Final    RIGHT ANTECUBITAL BLOOD Performed at [facility] [address], [place], [place] [address]    Special Requests   Final    Blood Culture adequate volume BOTTLES DRAWN AEROBIC AND ANAEROBIC Performed at [facility] [address], [place], [place] [address]    Culture   Final    NO GROWTH < 24 HOURS Performed at [facility] [address] [place], [place] [address]    Report Status PENDING  Incomplete  Culture, blood (Routine X 2) w Reflex to ID Panel     Status: None (  Preliminary result)   Collection Time: 07/19/21  1:17 PM   Specimen: BLOOD  Result Value Ref Range Status   Specimen Description   Final    BLOOD SITE NOT SPECIFIED Performed at [facility] [address], [place], [place] [address]    Special Requests   Final    BOTTLES DRAWN AEROBIC ONLY Blood Culture results may not be optimal due to an inadequate volume of blood received in culture bottles Performed at [facility] [address], [place], [place] [address]    Culture   Final    NO GROWTH < 24 HOURS Performed at [facility] [address] [place], [place] [address]    Report Status PENDING  Incomplete     Labs: Basic Metabolic Panel: Recent Labs  Lab 07/18/21 0750 07/20/21 0346  NA 138 137  K 4.1 3.4*  CL 107 106  CO2 26 24  GLUCOSE 95 102*  BUN 9 18  CREATININE 0.73 0.65  CALCIUM 8.6* 8.9  MG  --  2.1  PHOS  --  3.7   Liver Function Tests: Recent Labs  Lab 07/20/21 0346  AST 13*  ALT 13  ALKPHOS 43  BILITOT 0.4  PROT 7.1  ALBUMIN 3.6   No results for input(s): LIPASE, AMYLASE in the last 168 hours. No results for input(s): AMMONIA in the last 168 hours. CBC: Recent Labs  Lab 07/18/21 0750 07/20/21 0346  WBC 3.6* 5.7  NEUTROABS 1.6* 2.8  HGB 10.5* 10.5*  HCT 36.0 34.3*  MCV 88.5 83.1  PLT 356 364   Cardiac Enzymes: Recent Labs  Lab 07/18/21 0750  CKTOTAL 59   BNP: BNP (last 3 results) No results for input(s): BNP in the last 8760 hours.  ProBNP (last 3 results) No results for input(s): PROBNP in the last 8760 hours.  CBG: No results for input(s): GLUCAP in the last 168 hours.     Signed:  Dia Crawford,  MD [facility]

## 2021-07-20 NOTE — Consult Note (Signed)
HPI:45 year old BF PMHx none.  Presents with 3 weeks worth of worsening pain in the left leg.  Patient states that her initial symptoms started the Saturday after Thanksgiving.  She states that when she went to bed on Friday night, she was able to walk.  He states that she woke up on Saturday morning after Thanksgiving with pain in her left leg.  She was unable to move her left leg.  Took her several hours for her to be able to put her leg on the floor.  After that she was moving around significantly.  He went back to work on Tuesday.  She was having a limp on the left leg.Patient was previously taking gabapentin but this was too strong for her.  She also was started on Cymbalta, which she reports developing blurred vision, later stated did not notice any difference in her pain.  Emergency room physician consulted neurology.  -Per neurology report suspect her weakness has a not organic etiology.  It is possible that she has some underlying pathology such as bursitis with embellishment.  If she continues to have symptoms could consider outpatient EMG to rule out underlying pathology with embellishment.  Neurology has subsequently signed off.  Psych consult was placed for see below weakness of both lower extremities 1223 per neurology suspect weaknesses psychogenic.  Evaluate and give recommendations for treatment.  Patient is a 45 year old female with no reported past medical history or past psychiatric history who was admitted on December 21 for leg pain.  See neurology findings above, suspected psychogenic nature. On assessment today, states she feels fine.  She is not sure why psychiatry was called.  She denies any acute stressors, triggers, and or recent traumatic events that would result in increase in physiological pain and or symptoms.  She denies any acute psychiatric symptoms.  She denies any previous psychiatric history.  She further denies any suicidal ideations, homicidal ideations, ideas of  reference, or first rank symptoms.  Patient states" today [birth year] my birthday.  I am not sure why you were called.  I have not seen a doctor yet, but they are drawing more blood.  Mom makes feel better and I am ready to go."  Patient does not express any interest in outpatient follow-up or psychological evaluation.  She reports she is employed through [employer], and stand on her legs a lot which could likely explain her pain. Patient denies any current needs for inpatient psychiatric consultation.  Complete psychiatric specialty exam, not completed in its entirety as patient was baffled about psych consult, and denied any concerns.  In terms of conversion disorder or psychogenic condition; most current guidelines for treatment are outpatient cognitive behavioral therapy, relaxation therapy, and or hypnosis.  Patient has previously tried SNRI, however developed blurred vision, and reportedly no improvement in her symptoms this medication was discontinued.  In the event patient does have underlying psychiatric conditions, will recommend outpatient psychological evaluation, and collectively a treatment plan that both can be agreed on.  Patient will benefit from antidepressant use in an outpatient setting, where she can be managed and watch closely for stabilization.  In most patients, conversion disorder tends to be self-limiting.  Most prognosis are associated with sudden onset, and or stressors.  The sooner patient seeks outpatient treatment, the better prognosis.  Historically confronting patients about the psychological nature of the symptoms i.e. psychogenic, can usually make them worse as most patients do not realize underlying stressors play a part in current symptoms.  Ongoing supportive psychotherapy, focused on coping with underlying conflicts and stress, can help bring about a resolution to conversion disorder.   Will update AVS to reflect outpatient services at [facility]; where patient can receive medication management, and therapy if she chooses.  -Psychiatry will sign off at this time.

## 2021-07-20 NOTE — TOC Transition Note (Signed)
Transition of Care [facility]) - CM/SW Discharge Note  Patient Details  Name: Paula Jarvis MRN: [id number] Date of Birth: March 04, 1976  Transition of Care [facility]) CM/SW Contact:  Sherie Don, LCSW Phone Number: 07/20/2021, 4:18 PM  Clinical Narrative: Patient will need a rolling walker to discharge home. CSW made DME referral to [facility] with [employer]. [employer] to deliver walker to patient's room after the order is placed. Patient aware the walker will be delivered to her room. TOC signing off.  Final next level of care: OP Rehab Barriers to Discharge: Barriers Resolved  Patient Goals and CMS Choice CMS Medicare.gov Compare Post Acute Care list provided to:: Patient Choice offered to / list presented to : Patient  Discharge Plan and Services      DME Arranged: Walker rolling DME Agency: [employer] Date DME Agency Contacted: 07/20/21 Representative spoke with at DME Agency: Andee Poles  Readmission Risk Interventions No flowsheet data found.

## 2021-07-20 NOTE — Progress Notes (Signed)
Physical Therapy Treatment Patient Details Name: Paula Jarvis MRN: [id number] DOB: 02/21/1976 Today's Date: 07/20/2021   History of Present Illness Paula Jarvis is a 45 yo female who presents with worsening pain in left leg since Thanksgiving. Pt is incidentally covid19 positive. MRI brian, cervical, thoracic, lumbar negative for cause of pain. PMH: thrombocytosis    PT Comments    Patient remains greatly limited by Lt LE weakness. LE testing completed and slight extensor tone noted at quad and in plantar flexors but this improved with PROM. Pt able to slide Lt LE forward to advance steps but unable to initiate hip flexion/knee flexion and tone noted when therapist attempted to assist. Patient ambulated increased distance today with support of RW, recommend walker for improved independence at home. Patient will benefit from OPPT follow up for further assessment of Lt LE weakness. Acute PT will progress as able.    Recommendations for follow up therapy are one component of a multi-disciplinary discharge planning process, led by the attending physician.  Recommendations may be updated based on patient status, additional functional criteria and insurance authorization.  Follow Up Recommendations  Outpatient PT     Assistance Recommended at Discharge Frequent or constant Supervision/Assistance (neuro)  Equipment Recommendations  Rolling walker (2 wheels)    Recommendations for Other Services       Precautions / Restrictions Precautions Precautions: Fall Precaution Comments: LLE weakness Restrictions Weight Bearing Restrictions: No     Mobility  Bed Mobility Overal bed mobility: Needs Assistance Bed Mobility: Supine to Sit;Sit to Supine     Supine to sit: Supervision Sit to supine: Supervision   General bed mobility comments: extra time to move to EOB and scoot in bed. Patient required use of UE's and belt to bring Lt LE off EOB. initially using UE's for Rt LE too but  then moving without assist.    Transfers Overall transfer level: Needs assistance Equipment used: Rolling walker (2 wheels) Transfers: Sit to/from Stand Sit to Stand: Supervision           General transfer comment: stand by/supervision for safety with rise from EOB, pt steady with reduced weight bearing on Lt LE.    Ambulation/Gait Ambulation/Gait assistance: Min guard;Min assist Gait Distance (Feet): 80 Feet Assistive device: Rolling walker (2 wheels) Gait Pattern/deviations: Step-to pattern;Step-through pattern;Decreased step length - left;Decreased dorsiflexion - left;Decreased weight shift to left Gait velocity: decr     General Gait Details: pt able to slide Lt LE forward but no dorsiflexion and on flexion at knee. manual facilitation from therpaist for increased Lt step length. extensor tone noted when therapist tried to facilitate knee flexion.   Stairs             Wheelchair Mobility    Modified Rankin (Stroke Patients Only)       Balance                                            Cognition Arousal/Alertness: Awake/alert Behavior During Therapy: WFL for tasks assessed/performed Overall Cognitive Status: Within Functional Limits for tasks assessed                                          Exercises      General Comments General comments (skin integrity, edema, etc.):  Lt LE testing: impaired light touch below knee. pt is able to sense joint motion. no clonus but dorsiflexion limited initially and improved with PROM. Knee flex/ext limited initially and improved with PROM.      Pertinent Vitals/Pain Pain Assessment: 0-10 Pain Score: 3  Pain Location: L hip Pain Descriptors / Indicators: Aching Pain Intervention(s): Limited activity within patient's tolerance;Monitored during session;Repositioned    Home Living                          Prior Function            PT Goals (current goals can now be  found in the care plan section) Acute Rehab PT Goals Patient Stated Goal: "find out what is wrong with me" PT Goal Formulation: With patient Time For Goal Achievement: 08/02/21 Potential to Achieve Goals: Good Progress towards PT goals: Progressing toward goals    Frequency    Min 3X/week      PT Plan Discharge plan needs to be updated;Equipment recommendations need to be updated    Co-evaluation              AM-PAC PT "6 Clicks" Mobility   Outcome Measure  Help needed turning from your back to your side while in a flat bed without using bedrails?: A Little Help needed moving from lying on your back to sitting on the side of a flat bed without using bedrails?: A Little Help needed moving to and from a bed to a chair (including a wheelchair)?: A Little Help needed standing up from a chair using your arms (e.g., wheelchair or bedside chair)?: A Little Help needed to walk in hospital room?: A Little Help needed climbing 3-5 steps with a railing? : A Lot 6 Click Score: 17    End of Session Equipment Utilized During Treatment: Gait belt Activity Tolerance: Patient tolerated treatment well;Patient limited by fatigue;Patient limited by pain Patient left: in bed;with call bell/phone within reach;with family/visitor present Nurse Communication: Mobility status PT Visit Diagnosis: Other abnormalities of gait and mobility (R26.89);Muscle weakness (generalized) (M62.81);Difficulty in walking, not elsewhere classified (R26.2);Other symptoms and signs involving the nervous system (R29.898)     Time: 0388-8280 PT Time Calculation (min) (ACUTE ONLY): 35 min  Charges:  $Gait Training: 8-22 mins $Therapeutic Activity: 8-22 mins                     Verner Mould, DPT Acute Rehabilitation Services Office [phone] Pager [phone]    Jacques Navy 07/20/2021, 4:56 PM

## 2021-07-24 ENCOUNTER — Ambulatory Visit: Payer: Commercial Managed Care - PPO | Admitting: Physician Assistant

## 2021-07-24 LAB — CULTURE, BLOOD (ROUTINE X 2)
Culture: NO GROWTH
Culture: NO GROWTH
Special Requests: ADEQUATE

## 2021-07-25 NOTE — Telephone Encounter (Signed)
I called [facility] to check the status it is still pending. They said it could take up to 3-4 business and with the holidays it was pushed out even more.

## 2021-07-25 NOTE — Telephone Encounter (Signed)
Paula Jarvis Josem Kaufmann: [phone] (exp. 07/17/21 to 10/15/21) I spoke with the patient she already went to the hospital and had the MRI's done, she wanted Dr. Billey Gosling aware that she already had the MRI's done last week in the hospital.

## 2021-07-31 NOTE — Telephone Encounter (Signed)
I reviewed her MRIs and they are normal other than some mild narrowing in her neck. This could cause numbness/tingling of her arms/hands but wouldn't cause her leg symptoms.

## 2021-07-31 NOTE — Telephone Encounter (Signed)
Noted. Per hospital discharge note patient was referred to [facility] for evaluation.

## 2021-07-31 NOTE — Telephone Encounter (Signed)
If her symptoms haven't improved we can order an EMG as a next step

## 2021-08-03 ENCOUNTER — Inpatient Hospital Stay: Payer: Commercial Managed Care - PPO | Admitting: Physician Assistant

## 2021-08-03 ENCOUNTER — Other Ambulatory Visit: Payer: Self-pay | Admitting: Physician Assistant

## 2021-08-03 DIAGNOSIS — R200 Anesthesia of skin: Secondary | ICD-10-CM

## 2021-10-15 ENCOUNTER — Encounter: Payer: Self-pay | Admitting: Physician Assistant

## 2022-04-27 ENCOUNTER — Ambulatory Visit: Admit: 2022-04-27 | Payer: Commercial Managed Care - PPO

## 2022-08-21 ENCOUNTER — Encounter: Payer: Self-pay | Admitting: Nurse Practitioner

## 2022-08-21 ENCOUNTER — Other Ambulatory Visit: Payer: Self-pay

## 2022-08-21 ENCOUNTER — Ambulatory Visit: Payer: Commercial Managed Care - PPO | Admitting: Nurse Practitioner

## 2022-08-21 VITALS — BP 137/82 | HR 68 | Resp 18 | Ht 69.0 in | Wt 216.0 lb

## 2022-08-21 DIAGNOSIS — R110 Nausea: Secondary | ICD-10-CM | POA: Diagnosis not present

## 2022-08-21 DIAGNOSIS — D329 Benign neoplasm of meninges, unspecified: Secondary | ICD-10-CM

## 2022-08-21 DIAGNOSIS — Z0000 Encounter for general adult medical examination without abnormal findings: Secondary | ICD-10-CM

## 2022-08-21 DIAGNOSIS — I10 Essential (primary) hypertension: Secondary | ICD-10-CM

## 2022-08-21 DIAGNOSIS — Z130 Encounter for screening for diseases of the blood and blood-forming organs and certain disorders involving the immune mechanism: Secondary | ICD-10-CM

## 2022-08-21 DIAGNOSIS — R519 Headache, unspecified: Secondary | ICD-10-CM | POA: Diagnosis not present

## 2022-08-21 MED ORDER — ATENOLOL 25 MG PO TABS: 25.0000 mg | ORAL_TABLET | Freq: Every day | ORAL | 3 refills | Status: DC

## 2022-08-21 MED ORDER — BUTALBITAL-APAP-CAFFEINE 50-325-40 MG PO TABS: 1.0000 | ORAL_TABLET | Freq: Four times a day (QID) | ORAL | 1 refills | Status: AC | PRN

## 2022-08-21 MED ORDER — ONDANSETRON HCL 4 MG PO TABS: 4.0000 mg | ORAL_TABLET | Freq: Three times a day (TID) | ORAL | 1 refills | Status: AC | PRN

## 2022-08-21 NOTE — Progress Notes (Signed)
Established patient visit   Patient: Paula Jarvis   DOB: [birth year]   47 y.o. Female  MRN: [id number] Visit Date: 08/21/2022   Chief Complaint  Patient presents with   Migraine   Subjective    Migraine  This is a new problem. The current episode started 1 to 4 weeks ago. The problem occurs constantly. The problem has been gradually worsening. The pain is located in the Occipital and temporal region. The pain radiates to the upper back. The pain quality is not similar to prior headaches. The pain is at a severity of 7/10. The pain is severe. Associated symptoms include dizziness, eye pain, nausea, neck pain, photophobia, a visual change and vomiting. Pertinent negatives include no abdominal pain, back pain, coughing, fever, rhinorrhea, sinus pressure, sore throat or weakness. The symptoms are aggravated by bright light, activity, noise and work. She has tried acetaminophen, NSAIDs and Excedrin for the symptoms. The treatment provided no relief.     The patient was seen at [facility]) 08/05/2022 -was given zofran.  -CT scan was done at the time of that visit. Findings were as follows -   CT HEAD WO IV CONTRAST  Narrative:  CT of the head: 08/03/2022 2:54 AM  HISTORY: 47 years old Female with severe headache,  COMPARISON: None available  TECHNIQUE: Multiple axial contiguous images were obtained through the head without intravenous contrast administered. This exam was performed according to our departmental dose-optimization program, which includes automated exposure control, adjustment of the mA and/or KV according to the patient's size and/or use of iterative reconstruction technique.  FINDINGS: The ventricles are within normal limits for size.   Both globes appear symmetric.   The mastoid air cells appear clear.   The visualized paranasal sinuses appear clear.   The calvarium is intact.   No extra-axial fluid collection is seen.   The gray-white matter  differentiation is within normal limits.   No midline shift or mass effect is apparent.   There are no findings to suggest acute intracranial hemorrhage.  There is a soft tissue nodule seen at the left frontal calvarium subcutaneous soft tissues which may represent a small sebaceous or epidermoid cysts.  There is a calcified lesion along the posterior midline falx, likely representing a meningioma.  Impression:  IMPRESSION: No acute intracranial hemorrhage is seen.   The patient states that she was told that she had migraine headache. She should take Excedrin and caffeine.  -pain is gradually worsening.  -has been out of work for past 3.5 weeks  -last day of work was 08/04/2022  -she was not given a work note.  -she does work for the [place] [employer].   Medications: Outpatient Medications Prior to Visit  Medication Sig   DULoxetine (CYMBALTA) 20 MG capsule Take 1 capsule (20 mg total) by mouth daily.   folic acid (FOLVITE) 1 MG tablet Take 1 tablet (1 mg total) by mouth daily.   pregabalin (LYRICA) 25 MG capsule Take 1 capsule (25 mg total) by mouth 2 (two) times daily.   thiamine 100 MG tablet Take 1 tablet (100 mg total) by mouth daily.   No facility-administered medications prior to visit.    Review of Systems  Constitutional:  Positive for activity change and fatigue. Negative for appetite change, chills and fever.  HENT:  Negative for congestion, postnasal drip, rhinorrhea, sinus pressure, sinus pain, sneezing and sore throat.   Eyes:  Positive for photophobia and pain.  Respiratory:  Negative for cough, chest  tightness, shortness of breath and wheezing.   Cardiovascular:  Negative for chest pain and palpitations.       Elevated blood pressure   Gastrointestinal:  Positive for nausea and vomiting. Negative for abdominal pain, constipation and diarrhea.  Endocrine: Negative for cold intolerance, heat intolerance, polydipsia and polyuria.  Genitourinary:  Negative for  dyspareunia, dysuria, flank pain, frequency and urgency.  Musculoskeletal:  Positive for neck pain. Negative for arthralgias, back pain and myalgias.  Skin:  Negative for rash.  Allergic/Immunologic: Negative for environmental allergies.  Neurological:  Positive for dizziness and headaches. Negative for weakness.  Hematological:  Negative for adenopathy.  Psychiatric/Behavioral:  The patient is not nervous/anxious.        Objective     Today's Vitals   08/21/22 1425 08/21/22 1521  BP: (Abnormal) 176/102 137/82  Pulse: 87 68  Resp: 18   SpO2: 96%   Weight: 216 lb (98 kg)   Height: '5\' 9"'$  (1.753 m)   PainSc: 7    PainLoc: Head    Body mass index is 31.9 kg/m.  BP Readings from Last 3 Encounters:  08/21/22 137/82  07/20/21 130/80  07/13/21 127/83    Wt Readings from Last 3 Encounters:  08/21/22 216 lb (98 kg)  07/20/21 209 lb 7 oz (95 kg)  07/13/21 210 lb 1 oz (95.3 kg)    Physical Exam Vitals and nursing note reviewed.  Constitutional:      General: She is in acute distress.     Appearance: Normal appearance. She is well-developed. She is obese.  HENT:     Head: Normocephalic and atraumatic.  Eyes:     Extraocular Movements: Extraocular movements intact.     Conjunctiva/sclera: Conjunctivae normal.     Pupils: Pupils are equal, round, and reactive to light.  Neck:     Vascular: No carotid bruit.  Cardiovascular:     Rate and Rhythm: Normal rate and regular rhythm.     Pulses: Normal pulses.     Heart sounds: Normal heart sounds.  Pulmonary:     Effort: Pulmonary effort is normal.     Breath sounds: Normal breath sounds.  Abdominal:     Palpations: Abdomen is soft.  Musculoskeletal:        General: Normal range of motion.     Cervical back: Normal range of motion and neck supple.  Lymphadenopathy:     Cervical: No cervical adenopathy.  Skin:    General: Skin is warm and dry.     Capillary Refill: Capillary refill takes less than 2 seconds.   Neurological:     General: No focal deficit present.     Mental Status: She is alert and oriented to person, place, and time.     Cranial Nerves: No cranial nerve deficit.     Sensory: No sensory deficit.     Motor: No weakness.     Coordination: Coordination normal.     Gait: Gait normal.     Deep Tendon Reflexes: Reflexes normal.  Psychiatric:        Mood and Affect: Mood normal.        Behavior: Behavior normal.        Thought Content: Thought content normal.        Judgment: Judgment normal.      Assessment & Plan    1. Acute intractable headache, unspecified headache type Severe headache, unrelieved by OTC excedrin or NSAIDs. Add fioricet. She may take 1 to 2 tablets every six hours as  needed for severe headache. Advised her not to drive or operate machinery after taking this medication until she knows how it makes her feel. She may become dizzy or drowsy. She voiced understanding.  - butalbital-acetaminophen-caffeine (FIORICET) 50-325-40 MG tablet; Take 1-2 tablets by mouth every 6 (six) hours as needed for headache.  Dispense: 40 tablet; Refill: 1  2. Meningioma [facility]) Reviewed CT scan done at [facility] ED. It showed a calcified lesion along the posterior midline falx, likely representing a meningioma. Refer to neurosurgery for further evaluation and treatment.  - Ambulatory referral to Neurosurgery  3. Essential hypertension Start atenolol 25 mg daily In the evening. Avoid excess salt. Monitor blood pressure at home. She understands the goal is to have her blood pressure at 140/80 or better.  - atenolol (TENORMIN) 25 MG tablet; Take 1 tablet (25 mg total) by mouth daily.  Dispense: 90 tablet; Refill: 3  4. Nausea May take 4 to 8 mg ondansetron every eight hours as needed for acute nausea.  - ondansetron (ZOFRAN) 4 MG tablet; Take 1-2 tablets (4-8 mg total) by mouth every 8 (eight) hours as needed for nausea or vomiting.  Dispense: 60 tablet; Refill: 1   Problem List Items  Addressed This Visit       Cardiovascular and Mediastinum   Essential hypertension   Relevant Medications   atenolol (TENORMIN) 25 MG tablet     Nervous and Auditory   Meningioma (HCC)   Relevant Orders   Ambulatory referral to Neurosurgery     Other   Acute intractable headache - Primary   Relevant Medications   butalbital-acetaminophen-caffeine (FIORICET) 50-325-40 MG tablet   atenolol (TENORMIN) 25 MG tablet   Nausea   Relevant Medications   ondansetron (ZOFRAN) 4 MG tablet     Return in about 3 weeks (around 09/11/2022) for - needs blood work in next day or two , blood pressure.         Ronnell Freshwater, NP  [facility] at [facility] [phone] (phone) [phone] (fax)  [place]

## 2022-08-22 ENCOUNTER — Other Ambulatory Visit: Payer: Commercial Managed Care - PPO

## 2022-08-26 ENCOUNTER — Ambulatory Visit: Payer: Commercial Managed Care - PPO | Admitting: Nurse Practitioner

## 2022-08-26 DIAGNOSIS — D329 Benign neoplasm of meninges, unspecified: Secondary | ICD-10-CM | POA: Insufficient documentation

## 2022-08-26 DIAGNOSIS — I10 Essential (primary) hypertension: Secondary | ICD-10-CM | POA: Insufficient documentation

## 2022-08-26 DIAGNOSIS — R110 Nausea: Secondary | ICD-10-CM | POA: Insufficient documentation

## 2022-08-26 DIAGNOSIS — R519 Headache, unspecified: Secondary | ICD-10-CM | POA: Insufficient documentation

## 2022-08-27 ENCOUNTER — Other Ambulatory Visit: Payer: Commercial Managed Care - PPO

## 2022-08-30 ENCOUNTER — Other Ambulatory Visit: Payer: Commercial Managed Care - PPO

## 2022-09-10 ENCOUNTER — Encounter: Payer: Self-pay | Admitting: Nurse Practitioner

## 2022-09-10 NOTE — Progress Notes (Signed)
Established patient visit   Patient: Paula Jarvis   DOB: Oct 01, 1975   47 y.o. Female  MRN: [id number] Visit Date: 09/11/2022   Chief Complaint  Patient presents with   Follow-up   Subjective    HPI  Follow up  -acute headache -blood pressure was elevated -added atenolol  -blood work ordered at last visit. Needs to have done. Can have drawn during today's visit if she is fasting  -did see neurosurgery due to meningioma present on CT done at ED visit.  -has had new MRI but has not had call from provider  -patient has asked for new referral to different neurosurgeon/ neurology  -provider stated that he would not do surgery on meningioma and this was not causing her symptoms.  -but then ordered the MRI after that. Still has not been released by the consulting provider.  -added atenolol for blood pressure  --improved and stable since last visit.  -prescription for fioricet to help with headache  --states that fioricet does make the headaches tolerable. Nothing takes the pain away completely.  -states that she does need to have form filled out for light duty at work. She wants to go back to work, but needs one where she can sit.  -having a hard time driving due to visual disturbance. -just had eye exam 06/04/2022. Got new glasses.    Medications: Outpatient Medications Prior to Visit  Medication Sig   atenolol (TENORMIN) 25 MG tablet Take 1 tablet (25 mg total) by mouth daily.   butalbital-acetaminophen-caffeine (FIORICET) 50-325-40 MG tablet Take 1-2 tablets by mouth every 6 (six) hours as needed for headache.   DULoxetine (CYMBALTA) 20 MG capsule Take 1 capsule (20 mg total) by mouth daily.   folic acid (FOLVITE) 1 MG tablet Take 1 tablet (1 mg total) by mouth daily.   ondansetron (ZOFRAN) 4 MG tablet Take 1-2 tablets (4-8 mg total) by mouth every 8 (eight) hours as needed for nausea or vomiting.   pregabalin (LYRICA) 25 MG capsule Take 1 capsule (25 mg total) by mouth 2 (two)  times daily.   thiamine 100 MG tablet Take 1 tablet (100 mg total) by mouth daily.   No facility-administered medications prior to visit.    Review of Systems See HPI      Objective     Today's Vitals   09/11/22 1025  BP: 126/81  Pulse: 65  SpO2: 99%  Weight: 216 lb (98 kg)  Height: 5\' 9"  (1.753 m)   Body mass index is 31.9 kg/m.  BP Readings from Last 3 Encounters:  09/11/22 126/81  08/21/22 137/82  07/20/21 130/80    Wt Readings from Last 3 Encounters:  09/11/22 216 lb (98 kg)  08/21/22 216 lb (98 kg)  07/20/21 209 lb 7 oz (95 kg)    Physical Exam Vitals and nursing note reviewed.  Constitutional:      Appearance: Normal appearance. She is well-developed.  HENT:     Head: Normocephalic and atraumatic.  Eyes:     Pupils: Pupils are equal, round, and reactive to light.  Cardiovascular:     Rate and Rhythm: Normal rate and regular rhythm.     Pulses: Normal pulses.     Heart sounds: Normal heart sounds.  Pulmonary:     Effort: Pulmonary effort is normal.     Breath sounds: Normal breath sounds.  Abdominal:     Palpations: Abdomen is soft.  Musculoskeletal:        General: Normal range of motion.  Cervical back: Normal range of motion and neck supple.  Lymphadenopathy:     Cervical: No cervical adenopathy.  Skin:    General: Skin is warm and dry.     Capillary Refill: Capillary refill takes less than 2 seconds.  Neurological:     General: No focal deficit present.     Mental Status: She is alert and oriented to person, place, and time. Mental status is at baseline.  Psychiatric:        Mood and Affect: Mood normal.        Behavior: Behavior normal.        Thought Content: Thought content normal.        Judgment: Judgment normal.     Results for orders placed or performed in visit on 09/11/22  CBC  Result Value Ref Range   WBC 3.4 3.4 - 10.8 x10E3/uL   RBC 4.23 3.77 - 5.28 x10E6/uL   Hemoglobin 10.5 (L) 11.1 - 15.9 g/dL   Hematocrit 32.4 (L)  34.0 - 46.6 %   MCV 77 (L) 79 - 97 fL   MCH 24.8 (L) 26.6 - 33.0 pg   MCHC 32.4 31.5 - 35.7 g/dL   RDW 16.7 (H) 11.7 - 15.4 %   Platelets 429 150 - 450 x10E3/uL  Hemoglobin A1c  Result Value Ref Range   Hgb A1c MFr Bld 5.7 (H) 4.8 - 5.6 %   Est. average glucose Bld gHb Est-mCnc 117 mg/dL  Comp Met (CMET)  Result Value Ref Range   Glucose 91 70 - 99 mg/dL   BUN 8 6 - 24 mg/dL   Creatinine, Ser 0.96 0.57 - 1.00 mg/dL   eGFR 74 >59 mL/min/1.73   BUN/Creatinine Ratio 8 (L) 9 - 23   Sodium 142 134 - 144 mmol/L   Potassium 4.9 3.5 - 5.2 mmol/L   Chloride 108 (H) 96 - 106 mmol/L   CO2 22 20 - 29 mmol/L   Calcium 9.2 8.7 - 10.2 mg/dL   Total Protein 7.2 6.0 - 8.5 g/dL   Albumin 4.1 3.9 - 4.9 g/dL   Globulin, Total 3.1 1.5 - 4.5 g/dL   Albumin/Globulin Ratio 1.3 1.2 - 2.2   Bilirubin Total <0.2 0.0 - 1.2 mg/dL   Alkaline Phosphatase 74 44 - 121 IU/L   AST 16 0 - 40 IU/L   ALT 17 0 - 32 IU/L  Lipid panel  Result Value Ref Range   Cholesterol, Total 190 100 - 199 mg/dL   Triglycerides 62 0 - 149 mg/dL   HDL 58 >39 mg/dL   VLDL Cholesterol Cal 12 5 - 40 mg/dL   LDL Chol Calc (NIH) 120 (H) 0 - 99 mg/dL   Chol/HDL Ratio 3.3 0.0 - 4.4 ratio  TSH  Result Value Ref Range   TSH 0.495 0.450 - 4.500 uIU/mL    Assessment & Plan    1. Acute intractable headache, unspecified headache type New referral to neurology for further evaluation today  - Ambulatory referral to Neurology  2. Meningioma [facility]) New referral to neurology for further evaluation today  - Ambulatory referral to Neurology  3. Screening for endocrine, nutritional, metabolic and immunity disorder Routine, fasting labs drawn during today's visit.  - CBC - Hemoglobin A1c - Comp Met (CMET) - Lipid panel - TSH  4. Health care maintenance Routine, fasting labs drawn during today's visit.  - CBC - Hemoglobin A1c - Comp Met (CMET) - Lipid panel - TSH   Problem List Items Addressed This Visit  Nervous and  Auditory   Meningioma [facility])   Relevant Orders   Ambulatory referral to Neurology     Other   Acute intractable headache - Primary   Relevant Orders   Ambulatory referral to Neurology   Other Visit Diagnoses     Screening for endocrine, nutritional, metabolic and immunity disorder       Health care maintenance            Return in about 4 months (around 01/10/2023) for blood pressure - please get MRI results from [place] neurosurgery done 09/05/2022. TY,.         Ronnell Freshwater, NP  [facility] at [facility] [phone] (phone) [phone] (fax)  [place]

## 2022-09-11 ENCOUNTER — Encounter: Payer: Self-pay | Admitting: Nurse Practitioner

## 2022-09-11 ENCOUNTER — Ambulatory Visit (INDEPENDENT_AMBULATORY_CARE_PROVIDER_SITE_OTHER): Payer: Commercial Managed Care - PPO | Admitting: Nurse Practitioner

## 2022-09-11 VITALS — BP 126/81 | HR 65 | Ht 69.0 in | Wt 216.0 lb

## 2022-09-11 DIAGNOSIS — R519 Headache, unspecified: Secondary | ICD-10-CM

## 2022-09-11 DIAGNOSIS — Z1329 Encounter for screening for other suspected endocrine disorder: Secondary | ICD-10-CM | POA: Diagnosis not present

## 2022-09-11 DIAGNOSIS — Z13228 Encounter for screening for other metabolic disorders: Secondary | ICD-10-CM

## 2022-09-11 DIAGNOSIS — D329 Benign neoplasm of meninges, unspecified: Secondary | ICD-10-CM | POA: Diagnosis not present

## 2022-09-11 DIAGNOSIS — Z130 Encounter for screening for diseases of the blood and blood-forming organs and certain disorders involving the immune mechanism: Secondary | ICD-10-CM

## 2022-09-11 DIAGNOSIS — Z1321 Encounter for screening for nutritional disorder: Secondary | ICD-10-CM

## 2022-09-11 DIAGNOSIS — Z0000 Encounter for general adult medical examination without abnormal findings: Secondary | ICD-10-CM

## 2022-09-12 LAB — HEMOGLOBIN A1C
Est. average glucose Bld gHb Est-mCnc: 117 mg/dL
Hgb A1c MFr Bld: 5.7 % — ABNORMAL HIGH (ref 4.8–5.6)

## 2022-09-12 LAB — COMPREHENSIVE METABOLIC PANEL
ALT: 17 IU/L (ref 0–32)
AST: 16 IU/L (ref 0–40)
Albumin/Globulin Ratio: 1.3 (ref 1.2–2.2)
Albumin: 4.1 g/dL (ref 3.9–4.9)
Alkaline Phosphatase: 74 IU/L (ref 44–121)
BUN/Creatinine Ratio: 8 — ABNORMAL LOW (ref 9–23)
BUN: 8 mg/dL (ref 6–24)
Bilirubin Total: <0.2 mg/dL (ref 0.0–1.2)
CO2: 22 mmol/L (ref 20–29)
Calcium: 9.2 mg/dL (ref 8.7–10.2)
Chloride: 108 mmol/L — ABNORMAL HIGH (ref 96–106)
Creatinine, Ser: 0.96 mg/dL (ref 0.57–1.00)
Globulin, Total: 3.1 g/dL (ref 1.5–4.5)
Glucose: 91 mg/dL (ref 70–99)
Potassium: 4.9 mmol/L (ref 3.5–5.2)
Sodium: 142 mmol/L (ref 134–144)
Total Protein: 7.2 g/dL (ref 6.0–8.5)
eGFR: 74 mL/min/{1.73_m2} (ref >59)

## 2022-09-12 LAB — CBC
Hematocrit: 32.4 % — ABNORMAL LOW (ref 34.0–46.6)
Hemoglobin: 10.5 g/dL — ABNORMAL LOW (ref 11.1–15.9)
MCH: 24.8 pg — ABNORMAL LOW (ref 26.6–33.0)
MCHC: 32.4 g/dL (ref 31.5–35.7)
MCV: 77 fL — ABNORMAL LOW (ref 79–97)
Platelets: 429 10*3/uL (ref 150–450)
RBC: 4.23 x10E6/uL (ref 3.77–5.28)
RDW: 16.7 % — ABNORMAL HIGH (ref 11.7–15.4)
WBC: 3.4 10*3/uL (ref 3.4–10.8)

## 2022-09-12 LAB — TSH: TSH: 0.495 u[IU]/mL (ref 0.450–4.500)

## 2022-09-12 LAB — LIPID PANEL
Chol/HDL Ratio: 3.3 ratio (ref 0.0–4.4)
Cholesterol, Total: 190 mg/dL (ref 100–199)
HDL: 58 mg/dL (ref >39)
LDL Chol Calc (NIH): 120 mg/dL — ABNORMAL HIGH (ref 0–99)
Triglycerides: 62 mg/dL (ref 0–149)
VLDL Cholesterol Cal: 12 mg/dL (ref 5–40)

## 2022-10-03 ENCOUNTER — Telehealth: Payer: Self-pay

## 2022-10-03 NOTE — Telephone Encounter (Signed)
Received forms it will be placed in Provider basket

## 2022-10-03 NOTE — Telephone Encounter (Signed)
Called pt to advise her of the 29$ fee. Pt states that Heather asked her to bring this paperwork and that she had never been asked to pay a fee for paperwork.   FYI

## 2022-10-03 NOTE — Telephone Encounter (Signed)
Pt daughter dropped off light duty paperwork for the provider to fill out.   I advised her that Nira Conn is out of the office until Tuesday.   Paperwork has been given to [place].

## 2022-10-08 NOTE — Telephone Encounter (Signed)
I feel like I need to waive this for her or let her pay in in the future so she can return to work.

## 2022-10-09 NOTE — Telephone Encounter (Signed)
Note not needed 

## 2022-10-09 NOTE — Telephone Encounter (Signed)
Pt is wanting to know if: she needs to let her know what I need on the form? How to fill it out. They require it to be very detailed.   Also: Also, Is she able to give me a prescription for iron pills or any other necessary pills from the results of my bloodwork. I never had a follow up or a conversation about the results.  I do see I am low on quite a few things.     Please advise

## 2022-10-09 NOTE — Telephone Encounter (Signed)
I would love to know what they need to know on the form

## 2023-01-27 NOTE — Progress Notes (Signed)
Labs slightly improved and stable.  Continue to monitor.

## 2023-07-27 ENCOUNTER — Emergency Department (HOSPITAL_COMMUNITY): Payer: Commercial Managed Care - PPO

## 2023-07-27 ENCOUNTER — Emergency Department (HOSPITAL_COMMUNITY)
Admission: EM | Admit: 2023-07-27 | Discharge: 2023-07-27 | Disposition: A | Discharge status: Home / Self Care | Payer: Commercial Managed Care - PPO | Attending: Emergency Medicine | Admitting: Emergency Medicine

## 2023-07-27 ENCOUNTER — Encounter (HOSPITAL_COMMUNITY): Payer: Self-pay

## 2023-07-27 DIAGNOSIS — R930 Abnormal findings on diagnostic imaging of skull and head, not elsewhere classified: Secondary | ICD-10-CM | POA: Diagnosis not present

## 2023-07-27 DIAGNOSIS — R079 Chest pain, unspecified: Secondary | ICD-10-CM | POA: Insufficient documentation

## 2023-07-27 DIAGNOSIS — R519 Headache, unspecified: Secondary | ICD-10-CM | POA: Insufficient documentation

## 2023-07-27 DIAGNOSIS — R531 Weakness: Secondary | ICD-10-CM | POA: Diagnosis not present

## 2023-07-27 LAB — HEPATIC FUNCTION PANEL
ALT: 17 U/L (ref 0–44)
AST: 21 U/L (ref 15–41)
Albumin: 3.6 g/dL (ref 3.5–5.0)
Alkaline Phosphatase: 62 U/L (ref 38–126)
Bilirubin, Direct: 0.1 mg/dL (ref 0.0–0.2)
Indirect Bilirubin: 0.2 mg/dL — ABNORMAL LOW (ref 0.3–0.9)
Total Bilirubin: 0.3 mg/dL (ref <1.2)
Total Protein: 6.7 g/dL (ref 6.5–8.1)

## 2023-07-27 LAB — BASIC METABOLIC PANEL
Anion gap: 6 (ref 5–15)
BUN: 10 mg/dL (ref 6–20)
CO2: 21 mmol/L — ABNORMAL LOW (ref 22–32)
Calcium: 9 mg/dL (ref 8.9–10.3)
Chloride: 111 mmol/L (ref 98–111)
Creatinine, Ser: 0.86 mg/dL (ref 0.44–1.00)
GFR, Estimated: >60 mL/min (ref >60)
Glucose, Bld: 92 mg/dL (ref 70–99)
Potassium: 3.7 mmol/L (ref 3.5–5.1)
Sodium: 138 mmol/L (ref 135–145)

## 2023-07-27 LAB — CBC
HCT: 36.8 % (ref 36.0–46.0)
Hemoglobin: 11.2 g/dL — ABNORMAL LOW (ref 12.0–15.0)
MCH: 25.7 pg — ABNORMAL LOW (ref 26.0–34.0)
MCHC: 30.4 g/dL (ref 30.0–36.0)
MCV: 84.4 fL (ref 80.0–100.0)
Platelets: 369 10*3/uL (ref 150–400)
RBC: 4.36 MIL/uL (ref 3.87–5.11)
RDW: 15.9 % — ABNORMAL HIGH (ref 11.5–15.5)
WBC: 3.6 10*3/uL — ABNORMAL LOW (ref 4.0–10.5)
nRBC: 0 % (ref 0.0–0.2)

## 2023-07-27 LAB — HCG, SERUM, QUALITATIVE: Preg, Serum: NEGATIVE

## 2023-07-27 LAB — TROPONIN I (HIGH SENSITIVITY)
Troponin I (High Sensitivity): 5 ng/L (ref <18)
Troponin I (High Sensitivity): 5 ng/L (ref <18)

## 2023-07-27 MED ORDER — IOHEXOL 350 MG/ML SOLN
100.0000 mL | Freq: Once | INTRAVENOUS | Status: AC | PRN
  Administered 2023-07-27: 100 mL via INTRAVENOUS

## 2023-07-27 MED ORDER — MIDAZOLAM HCL 2 MG/2ML IJ SOLN
1.0000 mg | Freq: Once | INTRAMUSCULAR | Status: AC
  Administered 2023-07-27: 1 mg via INTRAVENOUS
  Filled 2023-07-27: qty 2

## 2023-07-27 MED ORDER — GADOBUTROL 1 MMOL/ML IV SOLN
10.0000 mL | Freq: Once | INTRAVENOUS | Status: AC | PRN
  Administered 2023-07-27: 10 mL via INTRAVENOUS

## 2023-07-27 NOTE — ED Triage Notes (Addendum)
Pt arrives via ems to the er for the c/o chest pain that started around 0030 today. Pt this morning also began to have dizziness, diaphoretic, hr that will bounce from 70 to 30bpm. Hx benign brain tumor. On arrival to ems pt is now c/o weakness / heaviness on left side of her body.   Vs with ems 141bg 150/85

## 2023-07-27 NOTE — ED Provider Notes (Signed)
[place] EMERGENCY DEPARTMENT AT [facility] Provider Note   CSN: [id number] Arrival date & time: 07/27/23  1011     History  Chief Complaint  Patient presents with   Chest Pain    Paula Jarvis is a 47 y.o. female.  47 yo F with a chief complaint of chest pain.  The patient said she woke up this morning and did not feel well and that was about 1230 and then about 8 AM she started feeling some uncomfortable sensation to her chest.  She ended up putting on a watch to track her heart rate and found it to be slow.  EMS was then called.  Per EMS it sounds like she has been having some frequent PVCs.  Some episodes of bigeminy.  She had an episode where she felt unwell as they arrived here to the emergency department and they found at the heart rate had decreased and her blood pressure had decreased as well.  She was less responsive during that time.  She has recovered from that event prior to making it to the room.  She is now complaining of a feeling that her left arm feels heavy and does not feel the same as the right.  She tells me she has a history of a meningioma to the falx.  Has had some headaches thought to be due from it in the past but not having a headache currently.   Chest Pain      Home Medications Prior to Admission medications   Medication Sig Start Date End Date Taking? Authorizing Provider  atenolol (TENORMIN) 25 MG tablet Take 1 tablet (25 mg total) by mouth daily. 08/21/22   Carlean Jews, NP  butalbital-acetaminophen-caffeine (FIORICET) [phone] MG tablet Take 1-2 tablets by mouth every 6 (six) hours as needed for headache. 08/21/22   Carlean Jews, NP  DULoxetine (CYMBALTA) 20 MG capsule Take 1 capsule (20 mg total) by mouth daily. 07/10/21   Mayer Masker, PA-C  folic acid (FOLVITE) 1 MG tablet Take 1 tablet (1 mg total) by mouth daily. 07/21/21   Drema Dallas, MD  ondansetron (ZOFRAN) 4 MG tablet Take 1-2 tablets (4-8 mg total) by mouth  every 8 (eight) hours as needed for nausea or vomiting. 08/21/22   Carlean Jews, NP  pregabalin (LYRICA) 25 MG capsule Take 1 capsule (25 mg total) by mouth 2 (two) times daily. 07/20/21   Drema Dallas, MD  thiamine 100 MG tablet Take 1 tablet (100 mg total) by mouth daily. 07/21/21   Drema Dallas, MD      Allergies    Patient has no known allergies.    Review of Systems   Review of Systems  Cardiovascular:  Positive for chest pain.    Physical Exam Updated Vital Signs BP (!) 130/110 (BP Location: Left Arm)   Pulse 70   Temp 97.7 F (36.5 C) (Oral)   Resp 16   Ht 5\' 9"  (1.753 m)   Wt 103 kg   SpO2 98%   BMI 33.52 kg/m  Physical Exam Vitals and nursing note reviewed.  Constitutional:      General: She is not in acute distress.    Appearance: She is well-developed. She is not diaphoretic.  HENT:     Head: Normocephalic and atraumatic.  Eyes:     Pupils: Pupils are equal, round, and reactive to light.  Cardiovascular:     Rate and Rhythm: Normal rate and regular rhythm.  Heart sounds: No murmur heard.    No friction rub. No gallop.  Pulmonary:     Effort: Pulmonary effort is normal.     Breath sounds: No wheezing or rales.  Abdominal:     General: There is no distension.     Palpations: Abdomen is soft.     Tenderness: There is no abdominal tenderness.  Musculoskeletal:        General: No tenderness.     Cervical back: Normal range of motion and neck supple.  Skin:    General: Skin is warm and dry.  Neurological:     Mental Status: She is alert and oriented to person, place, and time.     Cranial Nerves: Cranial nerves 2-12 are intact.     Sensory: Sensation is intact.     Motor: Motor function is intact.     Coordination: Coordination is intact.     Comments: Subjective decree sensation to touch on the right side of her face.  Otherwise benign neurologic exam.  Mild weakness diffusely.  Psychiatric:        Behavior: Behavior normal.     ED  Results / Procedures / Treatments   Labs (all labs ordered are listed, but only abnormal results are displayed) Labs Reviewed  CBC - Abnormal; Notable for the following components:      Result Value   WBC 3.6 (*)    Hemoglobin 11.2 (*)    MCH 25.7 (*)    RDW 15.9 (*)    All other components within normal limits  BASIC METABOLIC PANEL  HCG, SERUM, QUALITATIVE  HEPATIC FUNCTION PANEL  CBG MONITORING, ED  TROPONIN I (HIGH SENSITIVITY)    EKG None  Radiology No results found.  Procedures Procedures    Medications Ordered in ED Medications - No data to display  ED Course/ Medical Decision Making/ A&P                                 Medical Decision Making Amount and/or Complexity of Data Reviewed Labs: ordered. Radiology: ordered.  Risk Prescription drug management.   47 yo F with a chief complaints of not feeling well.  The patient woke up at midnight and was unable to go back to sleep and then about 8 AM she fell like she was having some chest discomfort.  She put on her Apple Watch and noted that her heart rate was slow and so she called EMS.  She had an events in the EMS bay where she became bradycardic and hypotensive.  Is now doing a bit better.  Reportedly was having lots of PVCs with EMS.  She now feels like her left arm is heavy.  I do not appreciate any obvious neurologic deficit.  She has no PVCs on the initial monitor here.  Heart rate in the 70s.  Will  check 2 troponins.  Chest x-ray.  CT of the head.  CT of the head without obvious acute intracranial pathology.  I discussed the patient with Dr. Otelia Limes, neurology recommended not activating as a code stroke.  Agreed with MRI.  As the patient has new chest pain with neurologic symptoms we will obtain a CT angiogram of the chest to assess for dissection.  Troponin negative x 2.  No acute anemia, no significant electrolyte abnormalities.  CT scan for dissection is negative.  MRI of the brain is negative.   Patient continues to be well.  No ectopy noted on the monitor.  I discussed results with the patient and family.  They are a bit upset that this keeps occurring and yet no underlying pathology is identified.  With the patient having reported bigeminy I will give her a referral to cardiology.  Will also refer her back to neurology.  The patients results and plan were reviewed and discussed.   Any x-rays performed were independently reviewed by myself.   Differential diagnosis were considered with the presenting HPI.  Medications  iohexol (OMNIPAQUE) 350 MG/ML injection 100 mL (100 mLs Intravenous Contrast Given 07/27/23 1211)  midazolam (VERSED) injection 1 mg (1 mg Intravenous Given 07/27/23 1425)  gadobutrol (GADAVIST) 1 MMOL/ML injection 10 mL (10 mLs Intravenous Contrast Given 07/27/23 1501)    Vitals:   07/27/23 1130 07/27/23 1305 07/27/23 1400 07/27/23 1537  BP:  129/69  119/69  Pulse: 63 (!) 57  60  Resp: 16 13  18   Temp:   97.7 F (36.5 C) 98 F (36.7 C)  TempSrc:    Oral  SpO2: 99% 99%  97%  Weight:      Height:        Final diagnoses:  Nonspecific chest pain  Weakness           Final Clinical Impression(s) / ED Diagnoses Final diagnoses:  None    Rx / DC Orders ED Discharge Orders     None         Melene Plan, DO 07/27/23 1547

## 2023-07-27 NOTE — ED Notes (Signed)
Patient transported to CT 

## 2023-07-27 NOTE — Discharge Instructions (Addendum)
Please follow-up with a cardiologist in the office.

## 2023-08-03 NOTE — Progress Notes (Signed)
 Cardiology Office Note:  .   Date:  08/06/2023  ID:  Paula Jarvis, DOB [birth year], MRN [id number] PCP: Hanford Powell BRAVO, NP  [facility] Providers Cardiologist:  None { History of Present Illness: .   Paula Jarvis is a 48 y.o. female with history of HTN who presents for the evaluation of chest pain at the request of Hanford Powell BRAVO, NP. Seen in ER 12/29 fpr CP with negative work-up.   CT Chest 07/27/2023 -> 0 CAC   History of Present Illness   Ms. Paula Jarvis, a 48 year old with a history of hypertension, presents for evaluation of chest pain, palpitations, and bradycardia. She reports episodes of chest pain that started about ten days ago. The pain is described as sharp and constant, located in the chest area. Accompanying the chest pain, she experiences episodes of dizziness, lightheadedness, and bradycardia. She notes that during these episodes, her vision becomes blurry and she feels very heavy. She also reports hot flashes occurring every hour and difficulty sleeping at night. She has been experiencing irregular menstrual cycles but is not in menopause. She denies any history of diabetes, heart attack, or stroke. She does not smoke or drink and maintains an active lifestyle due to her job at the [employer]. She has no known family history of heart disease. Symptoms seem to occur randomly without trigger. She tells me she cannot work. She feels her heart beating irregularly. EKG with PACs in office today. BP normal. No increased stress reported.         Problem List HTN    ROS: All other ROS reviewed and negative. Pertinent positives noted in the HPI.     Studies Reviewed: SABRA   EKG Interpretation Date/Time:  Wednesday August 06 2023 15:08:09 EST Ventricular Rate:  82 PR Interval:  142 QRS Duration:  84 QT Interval:  358 QTC Calculation: 418 R Axis:   51  Text Interpretation: Sinus rhythm with Premature supraventricular complexes When compared with ECG of  27-Jul-2023 10:17, Confirmed by Barbaraann Kotyk [phone]) on 08/06/2023 3:23:23 PM   Physical Exam:   VS:  BP 128/74 (BP Location: Left Arm, Patient Position: Sitting, Cuff Size: Large)   Pulse 82   Ht 5' 9 (1.753 m)   Wt 225 lb (102.1 kg)   SpO2 97%   BMI 33.23 kg/m    Wt Readings from Last 3 Encounters:  08/06/23 225 lb (102.1 kg)  07/27/23 227 lb (103 kg)  09/11/22 216 lb (98 kg)    GEN: Well nourished, well developed in no acute distress NECK: No JVD; No carotid bruits CARDIAC: RRR, no murmurs, rubs, gallops RESPIRATORY:  Clear to auscultation without rales, wheezing or rhonchi  ABDOMEN: Soft, non-tender, non-distended EXTREMITIES:  No edema; No deformity  ASSESSMENT AND PLAN: .   Assessment and Plan    Chest Pain and Dizziness PACs Light headedness  Symptoms started 10 days ago with sharp chest discomfort, dizziness, lightheadedness, and bradycardia. ER workup was negative. EKG shows sinus rhythm with PACs. Unclear unifying diagnosis. Chest CT with no coronary calcium. No Fam Hx of heart disease.  -unclear etiology for CP, but suspect non-cardiac.  -TSH today  -Order 14-day Zio monitor. -Start metoprolol  succinate 25mg  daily for PACs. -Order echocardiogram. -Advise patient to drink at least 80 ounces of water per day. -Recommend over-the-counter magnesium  supplement (400mg  of magnesium  glycinate daily). -she will follow with PCP as symptoms may not be completely cardiac in nature   Work Status Patient works at the [employer]  but is currently unable to work due to symptoms. -Provide note to excuse patient from work until follow-up visit in 4 weeks.              Follow-up: Return in about 1 month (around 09/06/2023).   Signed, Darryle DASEN. Barbaraann, MD, [facility]  [facility]  [address] [place], [place] [address] [phone]  6:46 PM

## 2023-08-06 ENCOUNTER — Encounter: Payer: Self-pay | Admitting: Cardiovascular Disease

## 2023-08-06 ENCOUNTER — Ambulatory Visit: Payer: Commercial Managed Care - PPO | Attending: Cardiovascular Disease | Admitting: Cardiovascular Disease

## 2023-08-06 ENCOUNTER — Ambulatory Visit (INDEPENDENT_AMBULATORY_CARE_PROVIDER_SITE_OTHER): Payer: Commercial Managed Care - PPO

## 2023-08-06 VITALS — BP 128/74 | HR 82 | Ht 69.0 in | Wt 225.0 lb

## 2023-08-06 DIAGNOSIS — R072 Precordial pain: Secondary | ICD-10-CM

## 2023-08-06 DIAGNOSIS — R42 Dizziness and giddiness: Secondary | ICD-10-CM

## 2023-08-06 DIAGNOSIS — I491 Atrial premature depolarization: Secondary | ICD-10-CM

## 2023-08-06 MED ORDER — MAGNESIUM GLYCINATE 100 MG PO CAPS: 400.0000 mg | ORAL_CAPSULE | Freq: Every day | ORAL | Status: AC

## 2023-08-06 MED ORDER — METOPROLOL SUCCINATE ER 25 MG PO TB24: 25.0000 mg | ORAL_TABLET | Freq: Every day | ORAL | 11 refills | Status: AC

## 2023-08-06 NOTE — Progress Notes (Unsigned)
 Enrolled patient for a 14 day Zio XT  monitor to be mailed to patients home

## 2023-08-06 NOTE — Patient Instructions (Signed)
 Medication Instructions:  Start Metoprolol  Succinate 25 mg daily Start Magnesium  Glycinate 400 mg every evening  80 ounces of Water daily *If you need a refill on your cardiac medications before your next appointment, please call your pharmacy*   Lab Work: TSH If you have labs (blood work) drawn today and your tests are completely normal, you will receive your results only by: MyChart Message (if you have MyChart) OR A paper copy in the mail If you have any lab test that is abnormal or we need to change your treatment, we will call you to review the results.   Testing/Procedures: Your physician has requested that you have an echocardiogram. Echocardiography is a painless test that uses sound waves to create images of your heart. It provides your doctor with information about the size and shape of your heart and how well your heart's chambers and valves are working. This procedure takes approximately one hour. There are no restrictions for this procedure. Please do NOT wear cologne, perfume, aftershave, or lotions (deodorant is allowed). Please arrive 15 minutes prior to your appointment time.  Please note: We ask at that you not bring children with you during ultrasound (echo/ vascular) testing. Due to room size and safety concerns, children are not allowed in the ultrasound rooms during exams. Our front office staff cannot provide observation of children in our lobby area while testing is being conducted. An adult accompanying a patient to their appointment will only be allowed in the ultrasound room at the discretion of the ultrasound technician under special circumstances. We apologize for any inconvenience.   Your physician has recommended that you wear a 14 DAY ZIO-PATCH monitor. The Zio patch cardiac monitor continuously records heart rhythm data for up to 14 days, this is for patients being evaluated for multiple types heart rhythms. For the first 24 hours post application, please avoid  getting the Zio monitor wet in the shower or by excessive sweating during exercise. After that, feel free to carry on with regular activities. Keep soaps and lotions away from the ZIO XT Patch.  This will be mailed to you, please expect 7-10 days to receive.    Applying the monitor   Shave hair from upper left chest.   Hold abrader disc by orange tab.  Rub abrader in 40 strokes over left upper chest as indicated in your monitor instructions.   Clean area with 4 enclosed alcohol pads .  Use all pads to assure are is cleaned thoroughly.  Let dry.   Apply patch as indicated in monitor instructions.  Patch will be place under collarbone on left side of chest with arrow pointing upward.   Rub patch adhesive wings for 2 minutes.Remove white label marked 1.  Remove white label marked 2.  Rub patch adhesive wings for 2 additional minutes.   While looking in a mirror, press and release button in center of patch.  A small green light will flash 3-4 times .  This will be your only indicator the monitor has been turned on.     Do not shower for the first 24 hours.  You may shower after the first 24 hours.   Press button if you feel a symptom. You will hear a small click.  Record Date, Time and Symptom in the Patient Log Book.   When you are ready to remove patch, follow instructions on last 2 pages of Patient Log Book.  Stick patch monitor onto last page of Patient Log Book.   Place  Patient Log Book in [facility]: Lambert Rhonda W box.  Use locking tab on box and tape box closed securely.  The [employer] has [employer] on it.  Please place in mailbox as soon as possible.  Your physician should have your test results approximately 7 days after the monitor has been mailed back to [facility].   Call [facility] Customer Care at [phone] if you have questions regarding your ZIO XT patch monitor.  Call them immediately if you see an orange light blinking on your monitor.   If your monitor falls off  in less than 4 days contact our Monitor department at [phone].  If your monitor becomes loose or falls off after 4 days call [employer] at [phone] for suggestions on securing your monitor    Follow-Up: At [facility], you and your health needs are our priority.  As part of our continuing mission to provide you with exceptional heart care, we have created designated Provider Care Teams.  These Care Teams include your primary Cardiologist (physician) and Advanced Practice Providers (APPs -  Physician Assistants and Nurse Practitioners) who all work together to provide you with the care you need, when you need it.  We recommend signing up for the patient portal called MyChart.  Sign up information is provided on this After Visit Summary.  MyChart is used to connect with patients for Virtual Visits (Telemedicine).  Patients are able to view lab/test results, encounter notes, upcoming appointments, etc.  Non-urgent messages can be sent to your provider as well.   To learn more about what you can do with MyChart, go to [email or web address].    Your next appointment:   4 week(s)  Provider:   Dr Barbaraann

## 2023-08-07 LAB — TSH: TSH: 1.2 u[IU]/mL (ref 0.450–4.500)

## 2023-08-13 DIAGNOSIS — I491 Atrial premature depolarization: Secondary | ICD-10-CM

## 2023-08-25 ENCOUNTER — Other Ambulatory Visit (HOSPITAL_BASED_OUTPATIENT_CLINIC_OR_DEPARTMENT_OTHER): Payer: Commercial Managed Care - PPO

## 2023-08-31 NOTE — Progress Notes (Deleted)
  Cardiology Office Note:  .   Date:  08/31/2023  ID:  Paula Jarvis, DOB 30-Oct-1975, MRN [id number] PCP: Hanford Powell BRAVO, NP  [facility] Providers Cardiologist:  None { Click to update primary MD,subspecialty MD or APP then REFRESH:1}   History of Present Illness: .   Paula Jarvis is a 48 y.o. female with history of HTN who presents for follow-up. Seen 1/8 for bradycardia, dizziness, chest pain. CT Chest 07/27/23 0 CAC. Did not complete monitor or echo.      Problem List HTN PACs    ROS: All other ROS reviewed and negative. Pertinent positives noted in the HPI.     Studies Reviewed: SABRA       Physical Exam:   VS:  There were no vitals taken for this visit.   Wt Readings from Last 3 Encounters:  08/06/23 225 lb (102.1 kg)  07/27/23 227 lb (103 kg)  09/11/22 216 lb (98 kg)    GEN: Well nourished, well developed in no acute distress NECK: No JVD; No carotid bruits CARDIAC: ***RRR, no murmurs, rubs, gallops RESPIRATORY:  Clear to auscultation without rales, wheezing or rhonchi  ABDOMEN: Soft, non-tender, non-distended EXTREMITIES:  No edema; No deformity  ASSESSMENT AND PLAN: .   ***    {Are you ordering a CV Procedure (e.g. stress test, cath, DCCV, TEE, etc)?   Press F2        :[id number]}   Follow-up: No follow-ups on file.  Time Spent with Patient: I have spent a total of *** minutes caring for this patient today face to face, ordering and reviewing labs/tests, reviewing prior records/medical history, examining the patient, establishing an assessment and plan, communicating results/findings to the patient/family, and documenting in the medical record.   Signed, Darryle DASEN. Barbaraann, MD, [facility]  [facility]  [address], [address] [place], [place] [address] [phone]  9:21 PM

## 2023-09-02 ENCOUNTER — Encounter: Payer: Self-pay | Admitting: Cardiovascular Disease

## 2023-09-03 ENCOUNTER — Ambulatory Visit: Payer: Commercial Managed Care - PPO | Admitting: Cardiovascular Disease

## 2023-09-03 DIAGNOSIS — R072 Precordial pain: Secondary | ICD-10-CM

## 2023-09-03 DIAGNOSIS — R42 Dizziness and giddiness: Secondary | ICD-10-CM

## 2023-09-03 DIAGNOSIS — I491 Atrial premature depolarization: Secondary | ICD-10-CM

## 2023-09-10 ENCOUNTER — Ambulatory Visit: Payer: Commercial Managed Care - PPO | Admitting: Cardiovascular Disease

## 2023-09-11 ENCOUNTER — Ambulatory Visit (HOSPITAL_BASED_OUTPATIENT_CLINIC_OR_DEPARTMENT_OTHER): Payer: Commercial Managed Care - PPO

## 2023-09-11 DIAGNOSIS — R072 Precordial pain: Secondary | ICD-10-CM | POA: Diagnosis not present

## 2023-09-12 ENCOUNTER — Encounter: Payer: Self-pay | Admitting: Cardiovascular Disease

## 2023-09-12 DIAGNOSIS — Z01812 Encounter for preprocedural laboratory examination: Secondary | ICD-10-CM

## 2023-09-12 DIAGNOSIS — R072 Precordial pain: Secondary | ICD-10-CM

## 2023-09-24 ENCOUNTER — Other Ambulatory Visit (HOSPITAL_COMMUNITY): Payer: Self-pay

## 2023-09-24 MED ORDER — METOPROLOL TARTRATE 100 MG PO TABS
100.0000 mg | ORAL_TABLET | Freq: Once | ORAL | 0 refills | Status: AC
  Filled 2023-09-24: qty 1, 1d supply, fill #0

## 2023-09-24 NOTE — Telephone Encounter (Signed)
 Patient identification verified by 2 forms. Shade Flood, RN     Called and spoke to patient. Per Dr. Gerri Spore O'Neal orders placed for CCTA, Metoprolol and Pre-procedural labwork (BMET).  Reviewed Pre-Procedural instructions with patient. Will also be forwarding to her via mychart. Patient will come into the office within next two weeks to complete labwork.    Reviewed ED warning signs/precautions  Patient verbalized understanding and agrees with plan.  No further questions at this time.         Your cardiac CT will be scheduled at one of the below locations:   [facility] [address] [place], [place] [address] [phone]   If scheduled at [facility], please arrive at the [facility] Entrance (Entrance C2) of [facility] 30 minutes prior to test start time. You can use the FREE valet parking offered at entrance C (encouraged to control the heart rate for the test)  Proceed to the [facility] Radiology Department (first floor) to check-in and test prep.  All radiology patients and guests should use entrance C2 at [facility], accessed from [facility], even though the hospital's physical address listed is [address]    If scheduled at [facility] or [facility], please arrive 15 mins early for check-in and test prep.  There is spacious parking and easy access to the radiology department from the [facility] Heart and Vascular entrance. Please enter here and check-in with the desk attendant.   If scheduled at [facility], please arrive 30 minutes early for check-in and test prep.  Please follow these instructions carefully (unless otherwise directed):  An IV will be required for this test and Nitroglycerin will be given.    On the Night Before the Test: Be sure to Drink plenty of water. Do not consume any caffeinated/decaffeinated beverages or  chocolate 12 hours prior to your test. Do not take any antihistamines 12 hours prior to your test.   On the Day of the Test: Drink plenty of water until 1 hour prior to the test. Do not eat any food 1 hour prior to test. You may take your regular medications prior to the test.  Take metoprolol (Lopressor) two hours prior to test. If you take Furosemide/Hydrochlorothiazide/Spironolactone/Chlorthalidone, please HOLD on the morning of the test. Patients who wear a continuous glucose monitor MUST remove the device prior to scanning. FEMALES- please wear underwire-free bra if available, avoid dresses & tight clothing  After the Test: Drink plenty of water. After receiving IV contrast, you may experience a mild flushed feeling. This is normal. On occasion, you may experience a mild rash up to 24 hours after the test. This is not dangerous. If this occurs, you can take Benadryl 25 mg, Zyrtec, Claritin, or Allegra and increase your fluid intake. (Patients taking Tikosyn should avoid Benadryl, and may take Zyrtec, Claritin, or Allegra) If you experience trouble breathing, this can be serious. If it is severe call 911 IMMEDIATELY. If it is mild, please call our office.  We will call to schedule your test 2-4 weeks out understanding that some insurance companies will need an authorization prior to the service being performed.   For more information and frequently asked questions, please visit our website : [email or web address]  For non-scheduling related questions, please contact the cardiac imaging nurse navigator should you have any questions/concerns: Cardiac Imaging Nurse Navigators Direct Office Dial: [phone]   For scheduling needs, including cancellations  and rescheduling, please call [place], [phone].

## 2023-09-25 ENCOUNTER — Other Ambulatory Visit (HOSPITAL_COMMUNITY): Payer: Self-pay

## 2023-09-26 LAB — ECHOCARDIOGRAM COMPLETE
Area-P 1/2: 4.89 cm2
S' Lateral: 2.72 cm

## 2023-10-03 ENCOUNTER — Encounter (HOSPITAL_BASED_OUTPATIENT_CLINIC_OR_DEPARTMENT_OTHER): Payer: Self-pay

## 2023-10-05 NOTE — Progress Notes (Deleted)
  Cardiology Office Note:  .   Date:  10/05/2023  ID:  Paula Jarvis, DOB [birth year], MRN [id number] PCP: Carlean Jews, NP  [facility] Providers Cardiologist:  None { Click to update primary MD,subspecialty MD or APP then REFRESH:1}   History of Present Illness: .   No chief complaint on file.   Paula Jarvis is a 48 y.o. female with history of HTN who presents for follow-up.      Problem List HTN    ROS: All other ROS reviewed and negative. Pertinent positives noted in the HPI.     Studies Reviewed: Marland Kitchen        TTE 09/26/2023  1. Left ventricular ejection fraction, by estimation, is 50 to 55%. Left  ventricular ejection fraction by 3D volume is 54 %. The left ventricle has  low normal function. The left ventricle has no regional wall motion  abnormalities. There is mild  concentric left ventricular hypertrophy. Left ventricular diastolic  parameters are indeterminate. The average left ventricular global  longitudinal strain is -15.8 %. The global longitudinal strain is normal.   2. Right ventricular systolic function is normal. The right ventricular  size is normal. There is normal pulmonary artery systolic pressure.   3. The mitral valve is normal in structure. No evidence of mitral valve  regurgitation. No evidence of mitral stenosis.   4. The aortic valve is tricuspid. Aortic valve regurgitation is not  visualized. No aortic stenosis is present.   5. The inferior vena cava is normal in size with greater than 50%  respiratory variability, suggesting right atrial pressure of 3 mmHg.  Physical Exam:   VS:  There were no vitals taken for this visit.   Wt Readings from Last 3 Encounters:  08/06/23 225 lb (102.1 kg)  07/27/23 227 lb (103 kg)  09/11/22 216 lb (98 kg)    GEN: Well nourished, well developed in no acute distress NECK: No JVD; No carotid bruits CARDIAC: ***RRR, no murmurs, rubs, gallops RESPIRATORY:  Clear to auscultation without rales, wheezing  or rhonchi  ABDOMEN: Soft, non-tender, non-distended EXTREMITIES:  No edema; No deformity  ASSESSMENT AND PLAN: .   ***    {Are you ordering a CV Procedure (e.g. stress test, cath, DCCV, TEE, etc)?   Press F2        :[id number]}   Follow-up: No follow-ups on file.  Time Spent with Patient: I have spent a total of *** minutes caring for this patient today face to face, ordering and reviewing labs/tests, reviewing prior records/medical history, examining the patient, establishing an assessment and plan, communicating results/findings to the patient/family, and documenting in the medical record.   Signed, Lenna Gilford. Flora Lipps, MD, [facility]  [facility]  [address], [address] [place], [place] [address] [phone]  9:21 PM

## 2023-10-06 ENCOUNTER — Ambulatory Visit: Payer: Commercial Managed Care - PPO | Admitting: Cardiovascular Disease

## 2023-10-06 ENCOUNTER — Other Ambulatory Visit (HOSPITAL_COMMUNITY): Payer: Self-pay

## 2023-10-06 DIAGNOSIS — R072 Precordial pain: Secondary | ICD-10-CM

## 2023-10-06 DIAGNOSIS — R42 Dizziness and giddiness: Secondary | ICD-10-CM

## 2023-10-06 DIAGNOSIS — I491 Atrial premature depolarization: Secondary | ICD-10-CM

## 2023-10-09 ENCOUNTER — Telehealth (HOSPITAL_COMMUNITY): Payer: Self-pay | Admitting: *Deleted

## 2023-10-09 NOTE — Telephone Encounter (Signed)
Attempted to call patient regarding upcoming cardiac CT appointment. Voicemail box full.  Larey Brick RN Navigator Cardiac Imaging [facility] Heart and Vascular Services [phone] Office [phone] Cell

## 2023-10-10 ENCOUNTER — Ambulatory Visit (HOSPITAL_COMMUNITY)
Admission: RE | Admit: 2023-10-10 | Discharge: 2023-10-10 | Disposition: A | Discharge status: Home / Self Care | Payer: Commercial Managed Care - PPO | Source: Ambulatory Visit | Attending: Cardiovascular Disease | Admitting: Cardiovascular Disease

## 2023-10-10 DIAGNOSIS — R072 Precordial pain: Secondary | ICD-10-CM | POA: Diagnosis present

## 2023-10-10 MED ORDER — IOHEXOL 350 MG/ML SOLN
95.0000 mL | Freq: Once | INTRAVENOUS | Status: AC | PRN
  Administered 2023-10-10: 95 mL via INTRAVENOUS

## 2023-10-10 MED ORDER — NITROGLYCERIN 0.4 MG SL SUBL
SUBLINGUAL_TABLET | SUBLINGUAL | Status: AC
  Filled 2023-10-10: qty 2

## 2023-10-10 MED ORDER — DILTIAZEM HCL 25 MG/5ML IV SOLN: 10.0000 mg | INTRAVENOUS | Status: DC | PRN

## 2023-10-10 MED ORDER — NITROGLYCERIN 0.4 MG SL SUBL
0.8000 mg | SUBLINGUAL_TABLET | Freq: Once | SUBLINGUAL | Status: AC
  Administered 2023-10-10: 0.8 mg via SUBLINGUAL

## 2023-10-10 MED ORDER — METOPROLOL TARTRATE 5 MG/5ML IV SOLN: 10.0000 mg | Freq: Once | INTRAVENOUS | Status: DC | PRN

## 2023-10-12 ENCOUNTER — Encounter: Payer: Self-pay | Admitting: Cardiovascular Disease

## 2023-10-20 ENCOUNTER — Encounter: Payer: Self-pay | Admitting: Cardiovascular Disease

## 2023-11-06 NOTE — Progress Notes (Deleted)
  Cardiology Office Note:  .   Date:  11/06/2023  ID:  Paula Jarvis, DOB 1975/09/24, MRN [id number] PCP: Paula Jews, NP  [facility] Providers Cardiologist:  None { Click to update primary MD,subspecialty MD or APP then REFRESH:1}   History of Present Illness: .   No chief complaint on file.   Paula Jarvis is a 48 y.o. female with history of HTN who presents for follow-up.      Problem List HTN    ROS: All other ROS reviewed and negative. Pertinent positives noted in the HPI.     Studies Reviewed: Paula Jarvis 10/19/2023 Impression: Brief supraventricular tachycardia detected (longest duration 9.7 seconds).  Frequent PACs (6.4% burden).   TTE 09/26/2023 1. Left ventricular ejection fraction, by estimation, is 50 to 55%. Left  ventricular ejection fraction by 3D volume is 54 %. The left ventricle has  low normal function. The left ventricle has no regional wall motion  abnormalities. There is mild  concentric left ventricular hypertrophy. Left ventricular diastolic  parameters are indeterminate. The average left ventricular global  longitudinal strain is -15.8 %. The global longitudinal strain is normal.   2. Right ventricular systolic function is normal. The right ventricular  size is normal. There is normal pulmonary artery systolic pressure.   3. The mitral valve is normal in structure. No evidence of mitral valve  regurgitation. No evidence of mitral stenosis.   4. The aortic valve is tricuspid. Aortic valve regurgitation is not  visualized. No aortic stenosis is present.   5. The inferior vena cava is normal in size with greater than 50%  respiratory variability, suggesting right atrial pressure of 3 mmHg.    CCTA 10/26/2023 IMPRESSION: 1. Coronary calcium score of 0. This was 0 percentile for age-, sex, and race-matched controls.   2. Total plaque volume 4 mm3 which is 42 percentile for age- and sex-matched controls (calcified plaque 0 mm3;  non-calcified plaque 4 mm3). TPV is (mild).   3. Normal coronary origin with right dominance.   4. Normal coronary arteries. Physical Exam:   VS:  There were no vitals taken for this visit.   Wt Readings from Last 3 Encounters:  08/06/23 225 lb (102.1 kg)  07/27/23 227 lb (103 kg)  09/11/22 216 lb (98 kg)    GEN: Well nourished, well developed in no acute distress NECK: No JVD; No carotid bruits CARDIAC: ***RRR, no murmurs, rubs, gallops RESPIRATORY:  Clear to auscultation without rales, wheezing or rhonchi  ABDOMEN: Soft, non-tender, non-distended EXTREMITIES:  No edema; No deformity  ASSESSMENT AND PLAN: .   ***    {Are you ordering a CV Procedure (e.g. stress test, cath, DCCV, TEE, etc)?   Press F2        :[id number]}   Follow-up: No follow-ups on file.  Time Spent with Patient: I have spent a total of *** minutes caring for this patient today face to face, ordering and reviewing labs/tests, reviewing prior records/medical history, examining the patient, establishing an assessment and plan, communicating results/findings to the patient/family, and documenting in the medical record.   Signed, Paula Jarvis. Paula Lipps, MD, [facility]  [facility]  [address] [place], [place] [address] [phone]  10:29 PM

## 2023-11-07 ENCOUNTER — Ambulatory Visit: Attending: Cardiovascular Disease | Admitting: Cardiovascular Disease

## 2023-11-07 DIAGNOSIS — R072 Precordial pain: Secondary | ICD-10-CM

## 2023-11-07 DIAGNOSIS — I491 Atrial premature depolarization: Secondary | ICD-10-CM

## 2023-11-07 DIAGNOSIS — R42 Dizziness and giddiness: Secondary | ICD-10-CM
